# Patient Record
Sex: Female | Born: 1990 | Race: White | Hispanic: No | Marital: Single | Smoking: Current every day smoker
Health system: Southern US, Community
[De-identification: ages and names within clinical notes are randomized; demographics above are authoritative.]

## PROBLEM LIST (undated history)

## (undated) ENCOUNTER — Inpatient Hospital Stay (HOSPITAL_COMMUNITY): Payer: Self-pay

## (undated) DIAGNOSIS — R0602 Shortness of breath: Secondary | ICD-10-CM

## (undated) DIAGNOSIS — L309 Dermatitis, unspecified: Secondary | ICD-10-CM

## (undated) DIAGNOSIS — R51 Headache: Secondary | ICD-10-CM

## (undated) DIAGNOSIS — F32A Depression, unspecified: Secondary | ICD-10-CM

## (undated) DIAGNOSIS — Z3400 Encounter for supervision of normal first pregnancy, unspecified trimester: Secondary | ICD-10-CM

## (undated) DIAGNOSIS — T7840XA Allergy, unspecified, initial encounter: Secondary | ICD-10-CM

## (undated) DIAGNOSIS — F419 Anxiety disorder, unspecified: Secondary | ICD-10-CM

## (undated) DIAGNOSIS — Z789 Other specified health status: Secondary | ICD-10-CM

## (undated) DIAGNOSIS — Z331 Pregnant state, incidental: Secondary | ICD-10-CM

## (undated) DIAGNOSIS — F909 Attention-deficit hyperactivity disorder, unspecified type: Secondary | ICD-10-CM

## (undated) DIAGNOSIS — J42 Unspecified chronic bronchitis: Secondary | ICD-10-CM

## (undated) DIAGNOSIS — N83209 Unspecified ovarian cyst, unspecified side: Secondary | ICD-10-CM

## (undated) HISTORY — DX: Dermatitis, unspecified: L30.9

## (undated) HISTORY — DX: Depression, unspecified: F32.A

## (undated) HISTORY — DX: Allergy, unspecified, initial encounter: T78.40XA

## (undated) HISTORY — DX: Attention-deficit hyperactivity disorder, unspecified type: F90.9

## (undated) HISTORY — DX: Anxiety disorder, unspecified: F41.9

## (undated) HISTORY — PX: TONSILLECTOMY: SUR1361

## (undated) HISTORY — DX: Unspecified ovarian cyst, unspecified side: N83.209

## (undated) HISTORY — PX: WISDOM TOOTH EXTRACTION: SHX21

## (undated) HISTORY — DX: Headache: R51

---
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
## 2012-01-03 LAB — STREP B DNA PROBE: GBS: NEGATIVE

## 2012-01-31 ENCOUNTER — Other Ambulatory Visit: Payer: Self-pay | Admitting: Obstetrics and Gynecology

## 2012-02-01 ENCOUNTER — Telehealth (HOSPITAL_COMMUNITY): Payer: Self-pay | Admitting: *Deleted

## 2012-02-01 ENCOUNTER — Encounter (HOSPITAL_COMMUNITY): Payer: Self-pay | Admitting: *Deleted

## 2012-02-01 NOTE — Telephone Encounter (Signed)
Preadmission screen  

## 2012-02-04 ENCOUNTER — Encounter (HOSPITAL_COMMUNITY): Payer: Self-pay

## 2012-02-04 ENCOUNTER — Inpatient Hospital Stay (HOSPITAL_COMMUNITY)
Admission: RE | Admit: 2012-02-04 | Discharge: 2012-02-07 | DRG: 775 | Disposition: A | Discharge status: Home / Self Care | Payer: Medicaid Other | Source: Ambulatory Visit | Attending: Obstetrics and Gynecology | Admitting: Obstetrics and Gynecology

## 2012-02-04 DIAGNOSIS — O480 Post-term pregnancy: Principal | ICD-10-CM | POA: Diagnosis present

## 2012-02-04 DIAGNOSIS — Z3400 Encounter for supervision of normal first pregnancy, unspecified trimester: Secondary | ICD-10-CM

## 2012-02-04 HISTORY — DX: Encounter for supervision of normal first pregnancy, unspecified trimester: Z34.00

## 2012-02-04 HISTORY — DX: Shortness of breath: R06.02

## 2012-02-04 LAB — CBC
Hemoglobin: 10.4 g/dL — ABNORMAL LOW (ref 12.0–15.0)
MCH: 26.9 pg (ref 26.0–34.0)
MCV: 82.9 fL (ref 78.0–100.0)
RBC: 3.86 MIL/uL — ABNORMAL LOW (ref 3.87–5.11)

## 2012-02-04 MED ORDER — TERBUTALINE SULFATE 1 MG/ML IJ SOLN: 0.2500 mg | Freq: Once | INTRAMUSCULAR | Status: AC | PRN

## 2012-02-04 MED ORDER — ACETAMINOPHEN 325 MG PO TABS: 650.0000 mg | ORAL_TABLET | ORAL | Status: DC | PRN

## 2012-02-04 MED ORDER — OXYTOCIN 20 UNITS IN LACTATED RINGERS INFUSION - SIMPLE: 125.0000 mL/h | Freq: Once | INTRAVENOUS | Status: DC

## 2012-02-04 MED ORDER — LACTATED RINGERS IV SOLN
INTRAVENOUS | Status: DC
  Administered 2012-02-05 (×2): via INTRAVENOUS

## 2012-02-04 MED ORDER — OXYTOCIN BOLUS FROM INFUSION
500.0000 mL | Freq: Once | INTRAVENOUS | Status: DC
  Filled 2012-02-04: qty 500

## 2012-02-04 MED ORDER — DINOPROSTONE 10 MG VA INST
10.0000 mg | VAGINAL_INSERT | Freq: Once | VAGINAL | Status: AC
  Administered 2012-02-04: 10 mg via VAGINAL
  Filled 2012-02-04: qty 1

## 2012-02-04 MED ORDER — OXYTOCIN 20 UNITS IN LACTATED RINGERS INFUSION - SIMPLE
1.0000 m[IU]/min | INTRAVENOUS | Status: DC
  Administered 2012-02-05: 999 m[IU]/min via INTRAVENOUS
  Administered 2012-02-05: 2 m[IU]/min via INTRAVENOUS
  Filled 2012-02-04: qty 1000

## 2012-02-04 MED ORDER — CITRIC ACID-SODIUM CITRATE 334-500 MG/5ML PO SOLN
30.0000 mL | ORAL | Status: DC | PRN
  Administered 2012-02-05: 30 mL via ORAL
  Filled 2012-02-04: qty 15

## 2012-02-04 MED ORDER — PRENATAL MULTIVITAMIN CH: 1.0000 | ORAL_TABLET | Freq: Every day | ORAL | Status: DC

## 2012-02-04 MED ORDER — ZOLPIDEM TARTRATE 10 MG PO TABS
10.0000 mg | ORAL_TABLET | Freq: Every evening | ORAL | Status: DC | PRN
  Administered 2012-02-04: 10 mg via ORAL
  Filled 2012-02-04: qty 1

## 2012-02-04 MED ORDER — FLEET ENEMA 7-19 GM/118ML RE ENEM: 1.0000 | ENEMA | RECTAL | Status: DC | PRN

## 2012-02-04 MED ORDER — ONDANSETRON HCL 4 MG/2ML IJ SOLN: 4.0000 mg | Freq: Four times a day (QID) | INTRAMUSCULAR | Status: DC | PRN

## 2012-02-04 MED ORDER — LACTATED RINGERS IV SOLN
500.0000 mL | INTRAVENOUS | Status: DC | PRN
  Administered 2012-02-05: 250 mL via INTRAVENOUS

## 2012-02-04 MED ORDER — BUTORPHANOL TARTRATE 2 MG/ML IJ SOLN
2.0000 mg | INTRAMUSCULAR | Status: DC | PRN
  Administered 2012-02-05: 2 mg via INTRAVENOUS
  Filled 2012-02-04: qty 1

## 2012-02-04 MED ORDER — LIDOCAINE HCL (PF) 1 % IJ SOLN
30.0000 mL | INTRAMUSCULAR | Status: DC | PRN
  Administered 2012-02-05: 30 mL via SUBCUTANEOUS
  Filled 2012-02-04: qty 30

## 2012-02-04 MED ORDER — IBUPROFEN 600 MG PO TABS: 600.0000 mg | ORAL_TABLET | Freq: Four times a day (QID) | ORAL | Status: DC | PRN

## 2012-02-04 MED ORDER — OXYCODONE-ACETAMINOPHEN 5-325 MG PO TABS: 1.0000 | ORAL_TABLET | ORAL | Status: DC | PRN

## 2012-02-04 NOTE — H&P (Signed)
Robin [name] is a 21 y.o. female presenting for G1P0 @ 40+ for IOL given post dates +FM, no LOF, no VB, occ ctx; largely uncomplicated PNC except Late to Care, ?Septate uterus. Maternal Medical History:  Fetal activity: Perceived fetal activity is normal.      OB History    Grav Para Term Preterm Abortions TAB SAB Ect Mult Living   1 0 0 0 0 0 0 0 0 0      Past Medical History  Diagnosis Date  . Pregnancy as incidental finding   . No pertinent past medical history   . ADHD (attention deficit hyperactivity disorder)   . Chronic bronchitis   . Eczema   . Headache     miagraine  . Ovarian cyst   . Shortness of breath   . Normal pregnancy, first 02/04/2012   Past Surgical History  Procedure Date  . Tonsillectomy    Family History: family history includes COPD in her maternal grandmother; Cancer in her maternal aunt, maternal grandmother, and maternal uncle; Chronic bronchitis in her mother; Depression in her maternal grandmother; Diabetes in her mother; Endometriosis in her mother; Heart attack in her paternal grandfather; Heart disease in her father, maternal grandmother, paternal grandfather, and paternal grandmother; Hyperlipidemia in her mother; Hypertension in her father and maternal grandmother; Hyperthyroidism in her maternal grandmother; Mental illness in her maternal grandmother; Migraines in her maternal grandmother and mother; Miscarriages / Stillbirths in her maternal grandmother and mother; Obesity in her father; and Stroke in her paternal grandfather. Social History:  reports that she quit smoking about 7 months ago. Her smoking use included Cigarettes. She has a .25 pack-year smoking history. She has never used smokeless tobacco. She reports that she does not drink alcohol or use illicit drugs.single Meds PNV, Zantac All PCN   Review of Systems  Constitutional: Negative.   HENT: Negative.   Eyes: Negative.   Respiratory: Negative.   Cardiovascular: Negative.     Gastrointestinal: Negative.   Genitourinary: Negative.   Musculoskeletal: Negative.   Skin: Negative.   Neurological: Negative.   Psychiatric/Behavioral: Negative.     Dilation: Fingertip Effacement (%): 50 Station: -3 Exam by:: Penley, RN  Blood pressure 143/80, pulse 95, temperature 98.6 F (37 C), temperature source Oral, resp. rate 18, height 5\' 4"  (1.626 m), weight 88.905 kg (196 lb), last menstrual period 04/23/2011, SpO2 100.00%. Maternal Exam:  Uterine Assessment: Contraction frequency is irregular.   Abdomen: Patient reports no abdominal tenderness. Fundal height is appropriate for gestation.   Estimated fetal weight is 7#.   Fetal presentation: vertex     Physical Exam  Constitutional: She is oriented to person, place, and time. She appears well-developed and well-nourished.  HENT:  Head: Normocephalic and atraumatic.  Eyes: Conjunctivae are normal. Pupils are equal, round, and reactive to light.  Neck: Normal range of motion. Neck supple.  Cardiovascular: Normal rate and regular rhythm.   Respiratory: Effort normal and breath sounds normal. No respiratory distress.  GI: Soft. Bowel sounds are normal. She exhibits no distension.  Musculoskeletal: Normal range of motion.  Neurological: She is alert and oriented to person, place, and time.  Skin: Skin is warm and dry.  Psychiatric: She has a normal mood and affect. Her behavior is normal.    Prenatal labs: ABO, Rh: O/Positive/-- (09/06 0000) Antibody: Negative (09/06 0000) Rubella: Immune (09/06 0000) RPR: Nonreactive (09/06 0000)  HBsAg: Negative (09/06 0000)  HIV: Non-reactive (09/06 0000)  GBS: Negative (01/07 0000)  glucola 117, Hgb 11.6,  Plt 392K, GC neg, Chl neg, First Tri Scr and AFP neg,glucola 117  [place] EDC 2/1 nl anat, female, post plac Assessment/Plan: 20yo G1 at 40+ for iol given post term, cervidil O/n then AROM and pitocin; epidural, IV pain meds prn, gbbs neg, expect  SVD   [name],Tambi Thole 02/04/2012, 10:16 PM

## 2012-02-05 ENCOUNTER — Inpatient Hospital Stay (HOSPITAL_COMMUNITY): Payer: Medicaid Other | Admitting: Anesthesiology

## 2012-02-05 ENCOUNTER — Encounter (HOSPITAL_COMMUNITY): Payer: Self-pay | Admitting: Anesthesiology

## 2012-02-05 ENCOUNTER — Encounter (HOSPITAL_COMMUNITY): Payer: Self-pay

## 2012-02-05 LAB — RPR: RPR Ser Ql: NONREACTIVE

## 2012-02-05 MED ORDER — PHENYLEPHRINE 40 MCG/ML (10ML) SYRINGE FOR IV PUSH (FOR BLOOD PRESSURE SUPPORT): 80.0000 ug | PREFILLED_SYRINGE | INTRAVENOUS | Status: DC | PRN

## 2012-02-05 MED ORDER — BUPIVACAINE HCL (PF) 0.25 % IJ SOLN
INTRAMUSCULAR | Status: DC | PRN
  Administered 2012-02-05 (×2): 5 mL via EPIDURAL

## 2012-02-05 MED ORDER — EPHEDRINE 5 MG/ML INJ: 10.0000 mg | INTRAVENOUS | Status: DC | PRN

## 2012-02-05 MED ORDER — DIPHENHYDRAMINE HCL 50 MG/ML IJ SOLN: 12.5000 mg | INTRAMUSCULAR | Status: DC | PRN

## 2012-02-05 MED ORDER — LIDOCAINE HCL 1.5 % IJ SOLN
INTRAMUSCULAR | Status: DC | PRN
  Administered 2012-02-05 (×3): 4 mL via INTRADERMAL

## 2012-02-05 MED ORDER — FENTANYL CITRATE 0.05 MG/ML IJ SOLN
INTRAMUSCULAR | Status: DC | PRN
  Administered 2012-02-05: 100 ug via EPIDURAL

## 2012-02-05 MED ORDER — PHENYLEPHRINE 40 MCG/ML (10ML) SYRINGE FOR IV PUSH (FOR BLOOD PRESSURE SUPPORT)
80.0000 ug | PREFILLED_SYRINGE | INTRAVENOUS | Status: DC | PRN
  Filled 2012-02-05: qty 5

## 2012-02-05 MED ORDER — FENTANYL CITRATE 0.05 MG/ML IJ SOLN
INTRAMUSCULAR | Status: AC
  Filled 2012-02-05: qty 2

## 2012-02-05 MED ORDER — EPHEDRINE 5 MG/ML INJ
10.0000 mg | INTRAVENOUS | Status: DC | PRN
  Filled 2012-02-05: qty 4

## 2012-02-05 MED ORDER — LACTATED RINGERS IV SOLN
500.0000 mL | Freq: Once | INTRAVENOUS | Status: AC
  Administered 2012-02-05: 500 mL via INTRAVENOUS

## 2012-02-05 MED ORDER — FENTANYL CITRATE 0.05 MG/ML IJ SOLN: 100.0000 ug | Freq: Once | INTRAMUSCULAR | Status: DC

## 2012-02-05 MED ORDER — FENTANYL 2.5 MCG/ML BUPIVACAINE 1/10 % EPIDURAL INFUSION (WH - ANES)
14.0000 mL/h | INTRAMUSCULAR | Status: DC
  Administered 2012-02-05 (×4): 14 mL/h via EPIDURAL
  Filled 2012-02-05 (×4): qty 60

## 2012-02-05 NOTE — Progress Notes (Signed)
Robin [name] is a 21 y.o. G1P0000 at [redacted]w[redacted]d  admitted for induction of labor due to Post dates.  Subjective: Uncomfortable with contractions  Objective: BP 109/84  Pulse 89  Temp(Src) 98.2 F (36.8 C) (Oral)  Resp 18  Ht 5\' 4"  (1.626 m)  Wt 88.905 kg (196 lb)  BMI 33.64 kg/m2  SpO2 100%  LMP 04/23/2011      FHT:  FHR: 140 bpm, variability: moderate,  accelerations:  Present,  decelerations:  Absent UC:   irregular, every 2-5 minutes SVE:   Dilation: 1 Effacement (%): 70 Station: -2 Exam by:: Carmelina Noun MD AROM for moderate meconium - d/w pt Pitocin at 61mU/min  Labs: Lab Results  Component Value Date   WBC 11.3* 02/04/2012   HGB 10.4* 02/04/2012   HCT 32.0* 02/04/2012   MCV 82.9 02/04/2012   PLT 326 02/04/2012    Assessment / Plan: Induction of labor due to postterm,  progressing slowly with pitocin and AROM  Labor: Progressing slowly Preeclampsia:  no signs or symptoms of toxicity Fetal Wellbeing:  Category II Pain Control:  Epidural and IV pain meds prn I/D:  n/a Anticipated MOD:  NSVD  [name],Crespin Forstrom 02/05/2012, 9:23 AM

## 2012-02-05 NOTE — Anesthesia Procedure Notes (Signed)
Epidural Patient location during procedure: OB Start time: 02/05/2012 11:13 AM Reason for block: procedure for pain  Staffing Performed by: anesthesiologist   Preanesthetic Checklist Completed: patient identified, site marked, surgical consent, pre-op evaluation, timeout performed, IV checked, risks and benefits discussed and monitors and equipment checked  Epidural Patient position: sitting Prep: site prepped and draped and DuraPrep Patient monitoring: continuous pulse ox and blood pressure Approach: midline Injection technique: LOR air  Needle:  Needle type: Tuohy  Needle gauge: 17 G Needle length: 9 cm Needle insertion depth: 6 cm Catheter type: closed end flexible Catheter size: 19 Gauge Catheter at skin depth: 11 cm Test dose: negative  Assessment Events: blood not aspirated, injection not painful, no injection resistance, negative IV test and no paresthesia  Additional Notes Discussed risk of headache, infection, bleeding, nerve injury and failed or incomplete block.  Patient voices understanding and wishes to proceed.

## 2012-02-05 NOTE — Progress Notes (Signed)
Tanee Henery is a 21 y.o. G1P0000 at [redacted]w[redacted]d admitted for induction of labor due to Post dates.  Subjective: comf with epidural, some epigastric pain.    Objective: BP 133/82  Pulse 105  Temp(Src) 97.6 F (36.4 C) (Oral)  Resp 20  Ht 5\' 4"  (1.626 m)  Wt 88.905 kg (196 lb)  BMI 33.64 kg/m2  SpO2 100%  LMP 04/23/2011      FHT:  FHR: 120-130 bpm, variability: moderate,  accelerations:  Present,  decelerations:  Absent UC:   irregular, every 3-6 minutes SVE:   Dilation: 10 Effacement (%): 100 Station: +1 Exam by:: k fields, rn  Labs: Lab Results  Component Value Date   WBC 11.3* 02/04/2012   HGB 10.4* 02/04/2012   HCT 32.0* 02/04/2012   MCV 82.9 02/04/2012   PLT 326 02/04/2012    Assessment / Plan: Induction of labor due to postterm,  progressing well on pitocin  Labor: Progressing normally Preeclampsia:  no signs or symptoms of toxicity Fetal Wellbeing:  Category II Pain Control:  Epidural I/D:  n/a Anticipated MOD:  NSVD Pt to labor down for approximately 1 hr, will then start pushing.  [name],Consuela Widener 02/05/2012, 6:40 PM

## 2012-02-05 NOTE — Progress Notes (Signed)
SVD of viable female 

## 2012-02-05 NOTE — Anesthesia Preprocedure Evaluation (Signed)
Anesthesia Evaluation  Patient identified by MRN, date of birth, ID band Patient awake    Reviewed: Allergy & Precautions, H&P , NPO status , Patient's Chart, lab work & pertinent test results, reviewed documented beta blocker date and time   History of Anesthesia Complications Negative for: history of anesthetic complications  Airway Mallampati: II TM Distance: >3 FB Neck ROM: full    Dental  (+) Teeth Intact   Pulmonary neg pulmonary ROS,  clear to auscultation        Cardiovascular neg cardio ROS regular Normal    Neuro/Psych  Headaches, PSYCHIATRIC DISORDERS (ADHD)    GI/Hepatic negative GI ROS, Neg liver ROS,   Endo/Other  Negative Endocrine ROS  Renal/GU negative Renal ROS  Genitourinary negative   Musculoskeletal   Abdominal   Peds  Hematology negative hematology ROS (+)   Anesthesia Other Findings   Reproductive/Obstetrics (+) Pregnancy                           Anesthesia Physical Anesthesia Plan  ASA: II  Anesthesia Plan: Epidural   Post-op Pain Management:    Induction:   Airway Management Planned:   Additional Equipment:   Intra-op Plan:   Post-operative Plan:   Informed Consent: I have reviewed the patients History and Physical, chart, labs and discussed the procedure including the risks, benefits and alternatives for the proposed anesthesia with the patient or authorized representative who has indicated his/her understanding and acceptance.     Plan Discussed with:   Anesthesia Plan Comments:         Anesthesia Quick Evaluation

## 2012-02-05 NOTE — Progress Notes (Signed)
EFM off for epidural.

## 2012-02-05 NOTE — Progress Notes (Signed)
Difficult to assess pt's level of pain due to pt's description, but pt states she is ok with current level

## 2012-02-05 NOTE — Progress Notes (Signed)
Pt c/o of constant pain in upper abd which is tender to touch. Small amt of bloody show noted and abd resting between ctx... MD made aware of pt's pain and will come assess.

## 2012-02-06 LAB — CBC
HCT: 25.2 % — ABNORMAL LOW (ref 36.0–46.0)
Hemoglobin: 8.3 g/dL — ABNORMAL LOW (ref 12.0–15.0)
MCHC: 32.9 g/dL (ref 30.0–36.0)
MCV: 82.6 fL (ref 78.0–100.0)
RDW: 14.7 % (ref 11.5–15.5)

## 2012-02-06 LAB — ABO/RH: ABO/RH(D): O POS

## 2012-02-06 MED ORDER — OXYCODONE-ACETAMINOPHEN 5-325 MG PO TABS
1.0000 | ORAL_TABLET | ORAL | Status: DC | PRN
  Administered 2012-02-06 (×2): 1 via ORAL
  Administered 2012-02-06: 2 via ORAL
  Administered 2012-02-06 – 2012-02-07 (×3): 1 via ORAL
  Filled 2012-02-06 (×2): qty 1
  Filled 2012-02-06: qty 2
  Filled 2012-02-06 (×3): qty 1

## 2012-02-06 MED ORDER — BENZOCAINE-MENTHOL 20-0.5 % EX AERO
1.0000 "application " | INHALATION_SPRAY | CUTANEOUS | Status: DC | PRN
  Administered 2012-02-06: 1 via TOPICAL

## 2012-02-06 MED ORDER — DIBUCAINE 1 % RE OINT: 1.0000 "application " | TOPICAL_OINTMENT | RECTAL | Status: DC | PRN

## 2012-02-06 MED ORDER — PRENATAL MULTIVITAMIN CH
1.0000 | ORAL_TABLET | Freq: Every day | ORAL | Status: DC
  Administered 2012-02-06 – 2012-02-07 (×2): 1 via ORAL
  Filled 2012-02-06 (×2): qty 1

## 2012-02-06 MED ORDER — BENZOCAINE-MENTHOL 20-0.5 % EX AERO
INHALATION_SPRAY | CUTANEOUS | Status: AC
  Administered 2012-02-06: 1 via TOPICAL
  Filled 2012-02-06: qty 56

## 2012-02-06 MED ORDER — SENNOSIDES-DOCUSATE SODIUM 8.6-50 MG PO TABS
2.0000 | ORAL_TABLET | Freq: Every day | ORAL | Status: DC
  Administered 2012-02-06: 2 via ORAL

## 2012-02-06 MED ORDER — ZOLPIDEM TARTRATE 5 MG PO TABS: 5.0000 mg | ORAL_TABLET | Freq: Every evening | ORAL | Status: DC | PRN

## 2012-02-06 MED ORDER — TETANUS-DIPHTH-ACELL PERTUSSIS 5-2.5-18.5 LF-MCG/0.5 IM SUSP: 0.5000 mL | Freq: Once | INTRAMUSCULAR | Status: DC

## 2012-02-06 MED ORDER — WITCH HAZEL-GLYCERIN EX PADS: 1.0000 "application " | MEDICATED_PAD | CUTANEOUS | Status: DC | PRN

## 2012-02-06 MED ORDER — ONDANSETRON HCL 4 MG PO TABS: 4.0000 mg | ORAL_TABLET | ORAL | Status: DC | PRN

## 2012-02-06 MED ORDER — IBUPROFEN 600 MG PO TABS
600.0000 mg | ORAL_TABLET | Freq: Four times a day (QID) | ORAL | Status: DC
  Administered 2012-02-06 – 2012-02-07 (×6): 600 mg via ORAL
  Filled 2012-02-06 (×6): qty 1

## 2012-02-06 MED ORDER — LACTATED RINGERS IV SOLN: INTRAVENOUS | Status: DC

## 2012-02-06 MED ORDER — DIPHENHYDRAMINE HCL 25 MG PO CAPS: 25.0000 mg | ORAL_CAPSULE | Freq: Four times a day (QID) | ORAL | Status: DC | PRN

## 2012-02-06 MED ORDER — LANOLIN HYDROUS EX OINT: TOPICAL_OINTMENT | CUTANEOUS | Status: DC | PRN

## 2012-02-06 MED ORDER — ONDANSETRON HCL 4 MG/2ML IJ SOLN: 4.0000 mg | INTRAMUSCULAR | Status: DC | PRN

## 2012-02-06 MED ORDER — SIMETHICONE 80 MG PO CHEW: 80.0000 mg | CHEWABLE_TABLET | ORAL | Status: DC | PRN

## 2012-02-06 NOTE — Progress Notes (Signed)
Transferred to room 108 via stretcher with family at side

## 2012-02-06 NOTE — Progress Notes (Signed)
Epidural Time out completed with Dr. Rodman Pickle

## 2012-02-06 NOTE — Anesthesia Postprocedure Evaluation (Signed)
  Anesthesia Post-op Note  Patient: Robin [name]  Procedure(s) Performed: * No procedures listed *  Patient Location: Mother/Baby  Anesthesia Type: Epidural  Level of Consciousness: awake, alert  and oriented  Airway and Oxygen Therapy: Patient Spontanous Breathing  Post-op Pain: mild  Post-op Assessment: Patient's Cardiovascular Status Stable, Respiratory Function Stable, Patent Airway, No signs of Nausea or vomiting and Pain level controlled  Post-op Vital Signs: stable  Complications: No apparent anesthesia complications

## 2012-02-06 NOTE — Progress Notes (Signed)
Post Partum Day 1 Subjective: no complaints, up ad lib and tolerating PO, nl lochia, pain controlled  Objective: Blood pressure 105/69, pulse 98, temperature 98.5 F (36.9 C), temperature source Oral, resp. rate 18, height 5\' 4"  (1.626 m), weight 88.905 kg (196 lb), last menstrual period 04/23/2011, SpO2 100.00%, unknown if currently breastfeeding.  Physical Exam:  General: alert and no distress Lochia: appropriate Uterine Fundus: firm    Basename 02/06/12 0535 02/04/12 1855  HGB 8.3* 10.4*  HCT 25.2* 32.0*    Assessment/Plan: Plan for discharge tomorrow, Breastfeeding and Lactation consult doing well   LOS: 2 days   [name],Umberto Pavek 02/06/2012, 9:51 AM

## 2012-02-07 MED ORDER — OXYCODONE-ACETAMINOPHEN 5-325 MG PO TABS: 1.0000 | ORAL_TABLET | Freq: Four times a day (QID) | ORAL | Status: AC | PRN

## 2012-02-07 MED ORDER — PRENATAL MULTIVITAMIN CH: 1.0000 | ORAL_TABLET | Freq: Every day | ORAL | Status: DC

## 2012-02-07 MED ORDER — IBUPROFEN 800 MG PO TABS: 800.0000 mg | ORAL_TABLET | Freq: Three times a day (TID) | ORAL | Status: AC | PRN

## 2012-02-07 NOTE — Progress Notes (Signed)
Post Partum Day 2 Subjective: no complaints, up ad lib, voiding, tolerating PO and nl lochia, pain controlled  Objective: Blood pressure 121/69, pulse 101, temperature 98.4 F (36.9 C), temperature source Oral, resp. rate 18, height 5\' 4"  (1.626 m), weight 88.905 kg (196 lb), last menstrual period 04/23/2011, SpO2 100.00%, unknown if currently breastfeeding.  Physical Exam:  General: alert and no distress Lochia: appropriate Uterine Fundus: firm   Basename 02/06/12 0535 02/04/12 1855  HGB 8.3* 10.4*  HCT 25.2* 32.0*    Assessment/Plan: Discharge home and Contraception POPs.  D/C with Motrin/Percocet/ PNV, f/u 6 weeks.     LOS: 3 days   [name],Nolon Yellin 02/07/2012, 8:19 AM

## 2012-02-07 NOTE — Progress Notes (Signed)
UR chart review completed.  

## 2012-02-07 NOTE — Discharge Summary (Signed)
Obstetric Discharge Summary Reason for Admission: induction of labor Prenatal Procedures: none Intrapartum Procedures: spontaneous vaginal delivery Postpartum Procedures: none Complications-Operative and Postpartum: 2nd degree perineal laceration Hemoglobin  Date Value Range Status  02/06/2012 8.3* 12.0-15.0 (g/dL) Final     REPEATED TO VERIFY     DELTA CHECK NOTED     HCT  Date Value Range Status  02/06/2012 25.2* 36.0-46.0 (%) Final    Discharge Diagnoses: Term Pregnancy-delivered  Discharge Information: Date: 02/07/2012 Activity: pelvic rest Diet: routine Medications: PNV, Ibuprofen and Percocet Condition: stable Instructions: refer to practice specific booklet Discharge to: home Follow-up Information    Follow up with [name],Robin Stankovich, MD. Schedule an appointment as soon as possible for a visit in 6 weeks.   Contact information:   [address] [facility] [address] [place] [address] [phone]          Newborn Data: Live born female  Birth Weight: 6 lb 11.2 oz (3040 g) APGAR: 7, 9  Home with mother.  [name],Robin [name] 02/07/2012, 8:28 AM

## 2012-05-04 ENCOUNTER — Ambulatory Visit: Payer: Medicaid Other

## 2012-05-16 ENCOUNTER — Ambulatory Visit: Payer: Medicaid Other | Attending: Obstetrics and Gynecology | Admitting: Physical Therapy

## 2012-08-17 ENCOUNTER — Inpatient Hospital Stay (HOSPITAL_COMMUNITY)
Admission: AD | Admit: 2012-08-17 | Discharge: 2012-08-17 | Disposition: A | Discharge status: Home / Self Care | Payer: Medicaid Other | Source: Ambulatory Visit | Attending: Obstetrics & Gynecology | Admitting: Obstetrics & Gynecology

## 2012-08-17 ENCOUNTER — Encounter (HOSPITAL_COMMUNITY): Payer: Self-pay | Admitting: *Deleted

## 2012-08-17 DIAGNOSIS — N949 Unspecified condition associated with female genital organs and menstrual cycle: Secondary | ICD-10-CM | POA: Insufficient documentation

## 2012-08-17 DIAGNOSIS — N938 Other specified abnormal uterine and vaginal bleeding: Secondary | ICD-10-CM | POA: Insufficient documentation

## 2012-08-17 DIAGNOSIS — N939 Abnormal uterine and vaginal bleeding, unspecified: Secondary | ICD-10-CM

## 2012-08-17 DIAGNOSIS — N926 Irregular menstruation, unspecified: Secondary | ICD-10-CM

## 2012-08-17 LAB — URINALYSIS, ROUTINE W REFLEX MICROSCOPIC
Glucose, UA: NEGATIVE mg/dL
Protein, ur: 30 mg/dL — AB
Urobilinogen, UA: 0.2 mg/dL (ref 0.0–1.0)

## 2012-08-17 LAB — WET PREP, GENITAL: Yeast Wet Prep HPF POC: NONE SEEN

## 2012-08-17 LAB — CBC
HCT: 35.9 % — ABNORMAL LOW (ref 36.0–46.0)
Platelets: 342 10*3/uL (ref 150–400)
RDW: 15.2 % (ref 11.5–15.5)
WBC: 7.5 10*3/uL (ref 4.0–10.5)

## 2012-08-17 LAB — URINE MICROSCOPIC-ADD ON

## 2012-08-17 MED ORDER — NORGESTIMATE-ETH ESTRADIOL 0.25-35 MG-MCG PO TABS: 1.0000 | ORAL_TABLET | Freq: Every day | ORAL | Status: DC

## 2012-08-17 NOTE — MAU Provider Note (Signed)
History     CSN: [id number]  Arrival date and time: 08/17/12 1604   First Provider Initiated Contact with Patient 08/17/12 1647      Chief Complaint  Patient presents with  . Vaginal Bleeding   HPI Pt is not pregnant and has been bleeding on Implanon since she delivered 01/2012.  She says she is bleeding every hour to 2 hours.  She has a few clots.  She has cramps.  She takes ibuprofen.  Past Medical History  Diagnosis Date  . Pregnancy as incidental finding   . No pertinent past medical history   . ADHD (attention deficit hyperactivity disorder)   . Chronic bronchitis   . Eczema   . Headache     miagraine  . Ovarian cyst   . Shortness of breath   . Normal pregnancy, first 02/04/2012  . SVD (spontaneous vaginal delivery) 02/05/2012    Past Surgical History  Procedure Date  . Tonsillectomy     Family History  Problem Relation Age of Onset  . Diabetes Mother   . Hyperlipidemia Mother   . Chronic bronchitis Mother   . Endometriosis Mother   . Migraines Mother   . Miscarriages / [place] Mother   . Cancer Maternal Aunt     renal  . Cancer Maternal Uncle     breast  . Mental illness Maternal Grandmother     bipolar, paranoid schizophrenia  . COPD Maternal Grandmother   . Depression Maternal Grandmother   . Heart disease Maternal Grandmother   . Hypertension Maternal Grandmother   . Hyperthyroidism Maternal Grandmother   . Cancer Maternal Grandmother     liver  . Migraines Maternal Grandmother   . Miscarriages / Stillbirths Maternal Grandmother   . Heart disease Paternal Grandmother   . Heart disease Paternal Grandfather   . Heart attack Paternal Grandfather   . Stroke Paternal Grandfather   . Heart disease Father   . Obesity Father   . Hypertension Father     History  Substance Use Topics  . Smoking status: Former Smoker -- 0.2 packs/day for 1 years    Types: Cigarettes    Quit date: 07/01/2011  . Smokeless tobacco: Never Used  . Alcohol Use: No     Allergies:  Allergies  Allergen Reactions  . Penicillins Hives and Itching    Prescriptions prior to admission  Medication Sig Dispense Refill  . Prenatal Vit-Fe Fumarate-FA (PRENATAL MULTIVITAMIN) TABS Take 1 tablet by mouth daily.  30 tablet  12  . ranitidine (ZANTAC) 150 MG tablet Take 150 mg by mouth 2 (two) times daily.      Marland Kitchen DISCONTD: Prenatal Vit-Fe Fumarate-FA (PRENATAL MULTIVITAMIN) TABS Take 1 tablet by mouth daily.        Review of Systems  Constitutional: Negative for fever and chills.  Gastrointestinal: Positive for abdominal pain. Negative for nausea, vomiting, diarrhea and constipation.  Genitourinary: Negative for dysuria and urgency.   Physical Exam   Blood pressure 135/77, pulse 89, temperature 98.5 F (36.9 C), temperature source Oral, resp. rate 18, height 5\' 3"  (1.6 m), weight 73.936 kg (163 lb), unknown if currently breastfeeding.  Physical Exam  Vitals reviewed. Constitutional: She is oriented to person, place, and time. She appears well-developed and well-nourished.  HENT:  Head: Normocephalic.  Eyes: Pupils are equal, round, and reactive to light.  Neck: Normal range of motion. Neck supple.  Cardiovascular: Normal rate.   Respiratory: Effort normal.  GI: Soft. She exhibits no distension. There is tenderness. There  is no rebound and no guarding.  Genitourinary:       Small amount of dark brown discharge in vault; cervix closed, mildly tender; bimanual diffusely tender - no appreciable enlargement  Musculoskeletal: Normal range of motion.  Neurological: She is alert and oriented to person, place, and time.  Skin: Skin is warm and dry.  Psychiatric: She has a normal mood and affect.    MAU Course  Procedures Results for orders placed during the hospital encounter of 08/17/12 (from the past 24 hour(s))  CBC     Status: Abnormal   Collection Time   08/17/12  4:23 PM      Component Value Range   WBC 7.5  4.0 - 10.5 K/uL   RBC 4.74  3.87 - 5.11  MIL/uL   Hemoglobin 11.2 (*) 12.0 - 15.0 g/dL   HCT 16.1 (*) 09.6 - 04.5 %   MCV 75.7 (*) 78.0 - 100.0 fL   MCH 23.6 (*) 26.0 - 34.0 pg   MCHC 31.2  30.0 - 36.0 g/dL   RDW 40.9  81.1 - 91.4 %   Platelets 342  150 - 400 K/uL  WET PREP, GENITAL     Status: Abnormal   Collection Time   08/17/12  4:45 PM      Component Value Range   Yeast Wet Prep HPF POC NONE SEEN  NONE SEEN   Trich, Wet Prep NONE SEEN  NONE SEEN   Clue Cells Wet Prep HPF POC FEW (*) NONE SEEN   WBC, Wet Prep HPF POC FEW (*) NONE SEEN  POCT PREGNANCY, URINE     Status: Normal   Collection Time   08/17/12  5:00 PM      Component Value Range   Preg Test, Ur NEGATIVE  NEGATIVE    Assessment and Plan  BTB on Implanon Will start on BCP 2 daily until bleeding stops then one daily for 2 months F/u with [facility] after Medicaid re-instated  Chany Woolworth 08/17/2012, 4:48 PM
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
## 2012-09-07 ENCOUNTER — Encounter (HOSPITAL_COMMUNITY): Payer: Self-pay | Admitting: Emergency Medicine

## 2012-09-07 ENCOUNTER — Emergency Department (HOSPITAL_COMMUNITY)
Admission: EM | Admit: 2012-09-07 | Discharge: 2012-09-07 | Disposition: A | Discharge status: Home / Self Care | Payer: Medicaid Other | Attending: Emergency Medicine | Admitting: Emergency Medicine

## 2012-09-07 DIAGNOSIS — B9789 Other viral agents as the cause of diseases classified elsewhere: Secondary | ICD-10-CM | POA: Insufficient documentation

## 2012-09-07 DIAGNOSIS — F909 Attention-deficit hyperactivity disorder, unspecified type: Secondary | ICD-10-CM | POA: Insufficient documentation

## 2012-09-07 DIAGNOSIS — Z87891 Personal history of nicotine dependence: Secondary | ICD-10-CM | POA: Insufficient documentation

## 2012-09-07 DIAGNOSIS — B349 Viral infection, unspecified: Secondary | ICD-10-CM

## 2012-09-07 MED ORDER — LACTINEX PO CHEW: 1.0000 | CHEWABLE_TABLET | Freq: Three times a day (TID) | ORAL | Status: DC

## 2012-09-07 MED ORDER — ONDANSETRON 8 MG PO TBDP: 8.0000 mg | ORAL_TABLET | Freq: Three times a day (TID) | ORAL | Status: AC | PRN

## 2012-09-07 NOTE — ED Provider Notes (Signed)
History     CSN: [id number]  Arrival date & time 09/07/12  1200   First MD Initiated Contact with Patient 09/07/12 1336      Chief Complaint  Patient presents with  . Fever    (Consider location/radiation/quality/duration/timing/severity/associated sxs/prior treatment) Patient is a 21 y.o. female presenting with fever. The history is provided by the patient.  Fever Primary symptoms of the febrile illness include fever, fatigue, headaches, cough, nausea, vomiting, diarrhea and myalgias. Primary symptoms do not include wheezing, shortness of breath, abdominal pain, dysuria, arthralgias or rash. The current episode started yesterday. This is a new problem. The problem has not changed since onset. The headache is not associated with weakness.  The vomiting began today. Vomiting occurs 2 to 5 times per day. The emesis contains stomach contents.  The diarrhea began today. The diarrhea is watery. The diarrhea occurs 2 to 4 times per day.  Myalgias began yesterday. The myalgias have been gradually improving since their onset. The myalgias are generalized. The myalgias are aching. The discomfort from the myalgias is mild. The myalgias are not associated with weakness, tenderness or swelling.   entire siblings and mother sick with cough/cold Past Medical History  Diagnosis Date  . Pregnancy as incidental finding   . No pertinent past medical history   . ADHD (attention deficit hyperactivity disorder)   . Chronic bronchitis   . Eczema   . Headache     miagraine  . Ovarian cyst   . Shortness of breath   . Normal pregnancy, first 02/04/2012  . SVD (spontaneous vaginal delivery) 02/05/2012    Past Surgical History  Procedure Date  . Tonsillectomy     Family History  Problem Relation Age of Onset  . Diabetes Mother   . Hyperlipidemia Mother   . Chronic bronchitis Mother   . Endometriosis Mother   . Migraines Mother   . Miscarriages / [place] Mother   . Cancer Maternal Aunt    renal  . Cancer Maternal Uncle     breast  . Mental illness Maternal Grandmother     bipolar, paranoid schizophrenia  . COPD Maternal Grandmother   . Depression Maternal Grandmother   . Heart disease Maternal Grandmother   . Hypertension Maternal Grandmother   . Hyperthyroidism Maternal Grandmother   . Cancer Maternal Grandmother     liver  . Migraines Maternal Grandmother   . Miscarriages / Stillbirths Maternal Grandmother   . Heart disease Paternal Grandmother   . Heart disease Paternal Grandfather   . Heart attack Paternal Grandfather   . Stroke Paternal Grandfather   . Heart disease Father   . Obesity Father   . Hypertension Father     History  Substance Use Topics  . Smoking status: Former Smoker -- 0.2 packs/day for 1 years    Types: Cigarettes    Quit date: 07/01/2011  . Smokeless tobacco: Never Used  . Alcohol Use: No    OB History    Grav Para Term Preterm Abortions TAB SAB Ect Mult Living   1 1 1  0 0 0 0 0 0 1      Review of Systems  Constitutional: Positive for fever and fatigue.  Respiratory: Positive for cough. Negative for shortness of breath and wheezing.   Gastrointestinal: Positive for nausea, vomiting and diarrhea. Negative for abdominal pain.  Genitourinary: Negative for dysuria.  Musculoskeletal: Positive for myalgias. Negative for arthralgias.  Skin: Negative for rash.  Neurological: Positive for headaches. Negative for weakness.  All other  systems reviewed and are negative.    Allergies  Penicillins  Home Medications   Current Outpatient Rx  Name Route Sig Dispense Refill  . IBUPROFEN 200 MG PO TABS Oral Take 400 mg by mouth every 6 (six) hours as needed. For pain    . IBUPROFEN 800 MG PO TABS Oral Take 800 mg by mouth every 8 (eight) hours as needed. migraine    . NORGESTIMATE-ETH ESTRADIOL 0.25-35 MG-MCG PO TABS Oral Take 1 tablet by mouth daily.    Marland Kitchen OVER THE COUNTER MEDICATION Oral Take 30 mLs by mouth daily as needed. For  flu-symptoms   otc flu relief    . LACTINEX PO CHEW Oral Chew 1 tablet by mouth 3 (three) times daily with meals. For 5 days 15 tablet 0  . ONDANSETRON 8 MG PO TBDP Oral Take 1 tablet (8 mg total) by mouth every 8 (eight) hours as needed for nausea (and vomiting for 1-2 days). 12 tablet 0    BP 121/67  Pulse 113  Temp 98.8 F (37.1 C)  Resp 16  Wt 159 lb 6 oz (72.292 kg)  SpO2 100%  LMP 09/04/2012  Breastfeeding? No  Physical Exam  Nursing note and vitals reviewed. Constitutional: She appears well-developed and well-nourished. No distress.  HENT:  Head: Normocephalic and atraumatic.  Right Ear: External ear normal.  Left Ear: External ear normal.  Nose: Mucosal edema and rhinorrhea present.  Eyes: Conjunctivae normal are normal. Right eye exhibits no discharge. Left eye exhibits no discharge. No scleral icterus.  Neck: Neck supple. No tracheal deviation present.  Cardiovascular: Normal rate.   Pulmonary/Chest: Effort normal. No stridor. No respiratory distress.  Musculoskeletal: She exhibits no edema.  Neurological: She is alert. Cranial nerve deficit: no gross deficits.  Skin: Skin is warm and dry. No rash noted.  Psychiatric: She has a normal mood and affect.    ED Course  Procedures (including critical care time)  Labs Reviewed - No data to display No results found.   1. Viral syndrome       MDM  Family questions answered and reassurance given and agrees with d/c and plan at this time. Child remains non toxic appearing and at this time most likely viral infection         Mickala Laton C. Erlinda Solinger, DO 09/07/12 1400

## 2012-09-07 NOTE — ED Notes (Signed)
Pt started with a fever last night , pt states it went as high as 102.8. She has been vomiting, diarrhea, and runny nose. Has a huge lymph swollen node on left side of her neck

## 2012-12-15 ENCOUNTER — Emergency Department: Payer: Self-pay | Admitting: Emergency Medicine

## 2013-01-11 ENCOUNTER — Emergency Department: Payer: Self-pay | Admitting: Emergency Medicine

## 2013-01-11 LAB — RAPID INFLUENZA A&B ANTIGENS

## 2013-08-31 ENCOUNTER — Encounter (HOSPITAL_COMMUNITY): Payer: Self-pay | Admitting: Emergency Medicine

## 2013-08-31 ENCOUNTER — Emergency Department (HOSPITAL_COMMUNITY)
Admission: EM | Admit: 2013-08-31 | Discharge: 2013-08-31 | Disposition: A | Discharge status: Home / Self Care | Payer: Medicaid Other | Attending: Emergency Medicine | Admitting: Emergency Medicine

## 2013-08-31 ENCOUNTER — Emergency Department (HOSPITAL_COMMUNITY): Payer: Medicaid Other

## 2013-08-31 DIAGNOSIS — Z8679 Personal history of other diseases of the circulatory system: Secondary | ICD-10-CM | POA: Insufficient documentation

## 2013-08-31 DIAGNOSIS — Z880 Allergy status to penicillin: Secondary | ICD-10-CM | POA: Insufficient documentation

## 2013-08-31 DIAGNOSIS — Z8742 Personal history of other diseases of the female genital tract: Secondary | ICD-10-CM | POA: Insufficient documentation

## 2013-08-31 DIAGNOSIS — S0993XA Unspecified injury of face, initial encounter: Secondary | ICD-10-CM | POA: Insufficient documentation

## 2013-08-31 DIAGNOSIS — R110 Nausea: Secondary | ICD-10-CM | POA: Insufficient documentation

## 2013-08-31 DIAGNOSIS — Z87891 Personal history of nicotine dependence: Secondary | ICD-10-CM | POA: Insufficient documentation

## 2013-08-31 DIAGNOSIS — Z872 Personal history of diseases of the skin and subcutaneous tissue: Secondary | ICD-10-CM | POA: Insufficient documentation

## 2013-08-31 DIAGNOSIS — Z8659 Personal history of other mental and behavioral disorders: Secondary | ICD-10-CM | POA: Insufficient documentation

## 2013-08-31 DIAGNOSIS — S060X9A Concussion with loss of consciousness of unspecified duration, initial encounter: Secondary | ICD-10-CM | POA: Insufficient documentation

## 2013-08-31 DIAGNOSIS — IMO0002 Reserved for concepts with insufficient information to code with codable children: Secondary | ICD-10-CM

## 2013-08-31 DIAGNOSIS — Z8709 Personal history of other diseases of the respiratory system: Secondary | ICD-10-CM | POA: Insufficient documentation

## 2013-08-31 MED ORDER — ONDANSETRON HCL 4 MG/2ML IJ SOLN
4.0000 mg | Freq: Once | INTRAMUSCULAR | Status: DC
  Filled 2013-08-31: qty 2

## 2013-08-31 MED ORDER — HYDROMORPHONE HCL PF 1 MG/ML IJ SOLN
1.0000 mg | Freq: Once | INTRAMUSCULAR | Status: DC
  Filled 2013-08-31: qty 1

## 2013-08-31 MED ORDER — ONDANSETRON 4 MG PO TBDP: 4.0000 mg | ORAL_TABLET | Freq: Three times a day (TID) | ORAL | Status: DC | PRN

## 2013-08-31 MED ORDER — OXYCODONE-ACETAMINOPHEN 5-325 MG PO TABS: 1.0000 | ORAL_TABLET | ORAL | Status: DC | PRN

## 2013-08-31 MED ORDER — ONDANSETRON 4 MG PO TBDP
8.0000 mg | ORAL_TABLET | Freq: Once | ORAL | Status: AC
  Administered 2013-08-31: 8 mg via ORAL
  Filled 2013-08-31: qty 2

## 2013-08-31 MED ORDER — HYDROMORPHONE HCL PF 2 MG/ML IJ SOLN
2.0000 mg | Freq: Once | INTRAMUSCULAR | Status: AC
  Administered 2013-08-31: 2 mg via INTRAMUSCULAR
  Filled 2013-08-31: qty 1

## 2013-08-31 NOTE — ED Provider Notes (Signed)
Medical screening examination/treatment/procedure(s) were performed by non-physician practitioner and as supervising physician I was immediately available for consultation/collaboration.   Darlys Gales, MD 08/31/13 2252
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
## 2013-09-02 ENCOUNTER — Emergency Department (HOSPITAL_COMMUNITY)
Admission: EM | Admit: 2013-09-02 | Discharge: 2013-09-03 | Disposition: A | Discharge status: Home / Self Care | Payer: Medicaid Other | Attending: Emergency Medicine | Admitting: Emergency Medicine

## 2013-09-02 ENCOUNTER — Encounter (HOSPITAL_COMMUNITY): Payer: Self-pay | Admitting: *Deleted

## 2013-09-02 DIAGNOSIS — Z8742 Personal history of other diseases of the female genital tract: Secondary | ICD-10-CM | POA: Insufficient documentation

## 2013-09-02 DIAGNOSIS — F909 Attention-deficit hyperactivity disorder, unspecified type: Secondary | ICD-10-CM | POA: Insufficient documentation

## 2013-09-02 DIAGNOSIS — IMO0002 Reserved for concepts with insufficient information to code with codable children: Secondary | ICD-10-CM | POA: Insufficient documentation

## 2013-09-02 DIAGNOSIS — S060X0S Concussion without loss of consciousness, sequela: Secondary | ICD-10-CM

## 2013-09-02 DIAGNOSIS — Y929 Unspecified place or not applicable: Secondary | ICD-10-CM | POA: Insufficient documentation

## 2013-09-02 DIAGNOSIS — S060X0A Concussion without loss of consciousness, initial encounter: Secondary | ICD-10-CM | POA: Insufficient documentation

## 2013-09-02 DIAGNOSIS — Z87891 Personal history of nicotine dependence: Secondary | ICD-10-CM | POA: Insufficient documentation

## 2013-09-02 DIAGNOSIS — L259 Unspecified contact dermatitis, unspecified cause: Secondary | ICD-10-CM | POA: Insufficient documentation

## 2013-09-02 DIAGNOSIS — R42 Dizziness and giddiness: Secondary | ICD-10-CM | POA: Insufficient documentation

## 2013-09-02 DIAGNOSIS — Z8669 Personal history of other diseases of the nervous system and sense organs: Secondary | ICD-10-CM | POA: Insufficient documentation

## 2013-09-02 DIAGNOSIS — Y939 Activity, unspecified: Secondary | ICD-10-CM | POA: Insufficient documentation

## 2013-09-02 DIAGNOSIS — Z880 Allergy status to penicillin: Secondary | ICD-10-CM | POA: Insufficient documentation

## 2013-09-02 DIAGNOSIS — R209 Unspecified disturbances of skin sensation: Secondary | ICD-10-CM | POA: Insufficient documentation

## 2013-09-02 MED ORDER — MECLIZINE HCL 50 MG PO TABS: 25.0000 mg | ORAL_TABLET | Freq: Three times a day (TID) | ORAL | Status: DC | PRN
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
## 2013-09-03 MED ORDER — MORPHINE SULFATE 4 MG/ML IJ SOLN
4.0000 mg | Freq: Once | INTRAMUSCULAR | Status: AC
  Administered 2013-09-03: 4 mg via INTRAMUSCULAR
  Filled 2013-09-03: qty 1

## 2013-09-03 MED ORDER — MECLIZINE HCL 25 MG PO TABS
25.0000 mg | ORAL_TABLET | Freq: Once | ORAL | Status: AC
  Administered 2013-09-03: 25 mg via ORAL
  Filled 2013-09-03: qty 1

## 2013-09-05 ENCOUNTER — Ambulatory Visit: Payer: Medicaid Other | Admitting: Neurology

## 2014-03-20 ENCOUNTER — Inpatient Hospital Stay (HOSPITAL_COMMUNITY)
Admission: AD | Admit: 2014-03-20 | Discharge: 2014-03-20 | Disposition: A | Discharge status: Home / Self Care | Payer: Medicaid Other | Source: Ambulatory Visit | Attending: Obstetrics and Gynecology | Admitting: Obstetrics and Gynecology

## 2014-03-20 NOTE — MAU Note (Signed)
Pt not in lobby.  

## 2014-03-22 ENCOUNTER — Encounter (HOSPITAL_COMMUNITY): Payer: Self-pay | Admitting: *Deleted

## 2014-03-22 ENCOUNTER — Inpatient Hospital Stay (HOSPITAL_COMMUNITY)
Admission: AD | Admit: 2014-03-22 | Discharge: 2014-03-22 | Disposition: A | Discharge status: Home / Self Care | Payer: Medicaid Other | Source: Ambulatory Visit | Attending: Obstetrics and Gynecology | Admitting: Obstetrics and Gynecology

## 2014-03-22 DIAGNOSIS — F17200 Nicotine dependence, unspecified, uncomplicated: Secondary | ICD-10-CM | POA: Insufficient documentation

## 2014-03-22 DIAGNOSIS — M79602 Pain in left arm: Secondary | ICD-10-CM

## 2014-03-22 DIAGNOSIS — Z3046 Encounter for surveillance of implantable subdermal contraceptive: Secondary | ICD-10-CM | POA: Insufficient documentation

## 2014-03-22 DIAGNOSIS — M79609 Pain in unspecified limb: Secondary | ICD-10-CM

## 2014-03-22 LAB — URINE MICROSCOPIC-ADD ON

## 2014-03-22 LAB — URINALYSIS, ROUTINE W REFLEX MICROSCOPIC
Bilirubin Urine: NEGATIVE
Glucose, UA: NEGATIVE mg/dL
KETONES UR: NEGATIVE mg/dL
LEUKOCYTES UA: NEGATIVE
NITRITE: NEGATIVE
PROTEIN: NEGATIVE mg/dL
Specific Gravity, Urine: 1.015 (ref 1.005–1.030)
UROBILINOGEN UA: 0.2 mg/dL (ref 0.0–1.0)
pH: 6 (ref 5.0–8.0)

## 2014-03-22 LAB — POCT PREGNANCY, URINE: PREG TEST UR: NEGATIVE

## 2014-03-22 MED ORDER — IBUPROFEN 800 MG PO TABS: 800.0000 mg | ORAL_TABLET | Freq: Three times a day (TID) | ORAL | Status: DC | PRN

## 2014-03-22 NOTE — MAU Provider Note (Signed)
History     CSN: [id number]  Arrival date and time: 03/22/14 1954   First Provider Initiated Contact with Patient 03/22/14 2223      Chief Complaint  Patient presents with  . Contraception   HPI Comments: Robin [name] 23 y.o. G1P1001 presents to MAU to have her Nexplanon removed because she feels it has moved and is hurting her. It has been in place for 2 years and she has an appointment to have it removed on Tuesday.     Past Medical History  Diagnosis Date  . Pregnancy as incidental finding   . No pertinent past medical history   . ADHD (attention deficit hyperactivity disorder)   . Chronic bronchitis   . Eczema   . Headache(784.0)     miagraine  . Ovarian cyst   . Shortness of breath   . Normal pregnancy, first 02/04/2012  . SVD (spontaneous vaginal delivery) 02/05/2012    Past Surgical History  Procedure Laterality Date  . Tonsillectomy      Family History  Problem Relation Age of Onset  . Diabetes Mother   . Hyperlipidemia Mother   . Chronic bronchitis Mother   . Endometriosis Mother   . Migraines Mother   . Misc[place]riages / [place] Mother   . Cancer Maternal Aunt     renal  . Cancer Maternal Uncle     breast  . Mental illness Maternal Grandmother     bipolar, paranoid schizophrenia  . COPD Maternal Grandmother   . Depression Maternal Grandmother   . Heart disease Maternal Grandmother   . Hypertension Maternal Grandmother   . Hyperthyroidism Maternal Grandmother   . Cancer Maternal Grandmother     liver  . Migraines Maternal Grandmother   . Miscarriages / Stillbirths Maternal Grandmother   . Heart disease Paternal Grandmother   . Heart disease Paternal Grandfather   . Heart attack Paternal Grandfather   . Stroke Paternal Grandfather   . Heart disease Father   . Obesity Father   . Hypertension Father     History  Substance Use Topics  . Smoking status: Current Every Day Smoker -- 0.50 packs/day for 1 years    Types: Cigarettes    Last  Attempt to Quit: 07/01/2011  . Smokeless tobacco: Never Used  . Alcohol Use: No    Allergies:  Allergies  Allergen Reactions  . Penicillins Hives and Itching    Prescriptions prior to admission  Medication Sig Dispense Refill  . etonogestrel (NEXPLANON) 68 MG IMPL implant Inject 1 each into the skin once.      Marland Kitchen. HYDROcodone-acetaminophen (NORCO/VICODIN) 5-325 MG per tablet Take 1 tablet by mouth every 6 (six) hours as needed for moderate pain.      Marland Kitchen. ondansetron (ZOFRAN ODT) 4 MG disintegrating tablet Take 1 tablet (4 mg total) by mouth every 8 (eight) hours as needed for nausea.  10 tablet  0    Review of Systems  Musculoskeletal:       Pain with Nexplanon    Physical Exam   Blood pressure 137/78, temperature 98.7 F (37.1 C), resp. rate 20, height 5\' 3"  (1.6 m), weight 156 lb 9.6 oz (71.033 kg), last menstrual period 03/15/2014.  Physical Exam  Constitutional: She appears well-developed and well-nourished.  Musculoskeletal:  Nexplanon palpable without redness, swelling, tenderness. No sx infection.   Psychiatric: Her affect is blunt. She is withdrawn.    MAU Course  Procedures  MDM    Assessment and Plan   A: Left arm  pain  P: Motrin 800 mg po TID prn pain Advised we do not remove Nexplanons in MAU Keep appointment for removal on Tuesday  Carolynn Serve 03/22/2014, 10:30 PM

## 2014-03-22 NOTE — Discharge Instructions (Signed)
Contraception Choices °Birth control (contraception) is the use of any methods or devices to stop pregnancy from happening. Below are some methods to help avoid pregnancy. °HORMONAL BIRTH CONTROL °· A small tube put under the skin of the upper arm (implant). The tube can stay in place for 3 years. The implant must be taken out after 3 years. °· Shots given every 3 months. °· Pills taken every day. °· Patches that are changed once a week. °· A ring put into the vagina (vaginal ring). The ring is left in place for 3 weeks and removed for 1 week. Then, a new ring is put in the vagina. °· Emergency birth control pills taken after unprotected sex (intercourse). °BARRIER BIRTH CONTROL  °· A thin covering worn on the penis (female condom) during sex. °· A soft, loose covering put into the vagina (female condom) before sex. °· A rubber bowl that sits over the cervix (diaphragm). The bowl must be made for you. The bowl is put into the vagina before sex. The bowl is left in place for 6 to 8 hours after sex. °· A small, soft cup that fits over the cervix (cervical cap). The cup must be made for you. The cup can be left in place for 48 hours after sex. °· A sponge that is put into the vagina before sex. °· A chemical that kills or stops sperm from getting into the cervix and uterus (spermicide). The chemical may be a cream, jelly, foam, or pill. °INTRAUTERINE (IUD) BIRTH CONTROL  °· IUD birth control is a small, T-shaped piece of plastic. The plastic is put inside the uterus. There are 2 types of IUD: °· Copper IUD. The IUD is covered in copper wire. The copper makes a fluid that kills sperm. It can stay in place for 10 years. °· Hormone IUD. The hormone stops pregnancy from happening. It can stay in place for 5 years. °PERMANENT METHODS °· When the woman has her fallopian tubes sealed, tied, or blocked during surgery. This stops the egg from traveling to the uterus. °· The doctor places a small coil or insert into each fallopian  tube. This causes scar tissue to form and blocks the fallopian tubes. °· When the female has the tubes that carry sperm tied off (vasectomy). °NATURAL FAMILY PLANNING BIRTH CONTROL  °· Natural family planning means not having sex or using barrier birth control on the days the woman could become pregnant. °· Use a calendar to keep track of the length of each period and know the days she can get pregnant. °· Avoid sex during ovulation. °· Use a thermometer to measure body temperature. Also watch for symptoms of ovulation. °· Time sex to be after the woman has ovulated. °Use condoms to help protect yourself against sexually transmitted infections (STIs). Do this no matter what type of birth control you use. Talk to your doctor about which type of birth control is best for you. °Document Released: 10/10/2009 Document Revised: 08/15/2013 Document Reviewed: 07/04/2013 °Exit[employer]® Patient Information ©2014 [employer]. ° °

## 2014-03-22 NOTE — MAU Note (Addendum)
[facility] [facility] [place] and was given Hydrocodone until could see doc Tues but cannot wait. Had Nexplanon placed about 2 yrs ago. Has moved down near elbow and hurts to straighten arm. Was told to wear sling and not to straighten arm. Causing pain and nausea. Sometimes arm goes numb. (area does not look red or swollen but pt states is sore to touch) Has off and on spotting and sometimes heavy periods

## 2014-07-31 LAB — OB RESULTS CONSOLE ANTIBODY SCREEN: ANTIBODY SCREEN: NEGATIVE

## 2014-07-31 LAB — OB RESULTS CONSOLE HIV ANTIBODY (ROUTINE TESTING): HIV: NONREACTIVE

## 2014-07-31 LAB — OB RESULTS CONSOLE ABO/RH: RH Type: POSITIVE

## 2014-07-31 LAB — OB RESULTS CONSOLE HEPATITIS B SURFACE ANTIGEN: Hepatitis B Surface Ag: NEGATIVE

## 2014-07-31 LAB — OB RESULTS CONSOLE GC/CHLAMYDIA
Chlamydia: NEGATIVE
Gonorrhea: NEGATIVE

## 2014-07-31 LAB — OB RESULTS CONSOLE RPR: RPR: NONREACTIVE

## 2014-07-31 LAB — OB RESULTS CONSOLE RUBELLA ANTIBODY, IGM: Rubella: IMMUNE

## 2014-08-15 ENCOUNTER — Encounter: Payer: Self-pay | Admitting: Neurology

## 2014-08-15 ENCOUNTER — Ambulatory Visit (INDEPENDENT_AMBULATORY_CARE_PROVIDER_SITE_OTHER): Payer: Medicaid Other | Admitting: Neurology

## 2014-08-15 VITALS — BP 112/73 | HR 92 | Ht 63.0 in | Wt 161.0 lb

## 2014-08-15 DIAGNOSIS — O0001 Abdominal pregnancy with intrauterine pregnancy: Secondary | ICD-10-CM

## 2014-08-15 DIAGNOSIS — R519 Headache, unspecified: Secondary | ICD-10-CM

## 2014-08-15 DIAGNOSIS — G43909 Migraine, unspecified, not intractable, without status migrainosus: Secondary | ICD-10-CM | POA: Insufficient documentation

## 2014-08-15 DIAGNOSIS — T3995XA Adverse effect of unspecified nonopioid analgesic, antipyretic and antirheumatic, initial encounter: Secondary | ICD-10-CM

## 2014-08-15 DIAGNOSIS — R51 Headache: Secondary | ICD-10-CM

## 2014-08-15 DIAGNOSIS — G4440 Drug-induced headache, not elsewhere classified, not intractable: Secondary | ICD-10-CM

## 2014-08-15 MED ORDER — PROPRANOLOL HCL ER 60 MG PO CP24: 60.0000 mg | ORAL_CAPSULE | Freq: Every day | ORAL | Status: DC

## 2014-08-15 MED ORDER — ACETAMINOPHEN-CODEINE #3 300-30 MG PO TABS: 2.0000 | ORAL_TABLET | Freq: Four times a day (QID) | ORAL | Status: DC | PRN

## 2014-08-15 NOTE — Progress Notes (Signed)
[facility] [address] [place].  [address] [phone]       OFFICE CONSULT NOTE  [name] Date of Birth:  29-Jul-1991 Medical Record Number:  [id number]   Referring MD:  Robin [name]  Reason for Referral:  Headache and numbness  HPI: 11 year Caucasian lady who is [redacted] weeks pregnant and has had a history of migraine headaches for the last one year or so. She describes the headache as being retro-orbital and occipital throbbing and severe in nature 8/10 in severity. Accompanied by nausea light and sound sensitivity. It is increased with physical activity and partially relieved by rest. She denies any focal neurological symptoms or complaint headache or typical zigzag lines or visual phenomena but does have occasional blurred vision with the headaches. She also had some dizziness when the headache is severe. She has tried taking Tylenol and ibuprofen which did not help. His the last 3-4 months cinches pregnant she is having daily headaches. She has been taking 2-4 tablets of Tylenol every day without relief. She does have some history of migraine in her mother. She drinks 4 cups of soda every day. She is unable to admit for specific triggers for her headaches. She does admit that she gets daily for 5 hours of sleep every night. She does have a 81-year-old daughter. She is currently not working. She has never been on migraine specific medications for preventative medications in the past. She did have a CT scan of the head on 08/31/13 which I personally reviewed 2 unremarkable but has not had any MRI scan done. She complains of some numbness on the right side of face and arm and occasionally the leg as well. She denies any weakness, gait and balance difficulties, vertigo, diplopia, fatigability or lack of bladder bowel control or urgency. She has never seen a neurologist or been evaluated in a headache clinic.  ROS:   14 system review of systems is positive for  headache, eye pain, blurred vision, numbness, confusion, memory loss, dizziness, restless legs, anxiety, depression, not enough sleep and decreased energy.  PMH:  Past Medical History  Diagnosis Date  . Pregnancy as incidental finding   . No pertinent past medical history   . ADHD (attention deficit hyperactivity disorder)   . Chronic bronchitis   . Eczema   . Headache(784.0)     miagraine  . Ovarian cyst   . Shortness of breath   . Normal pregnancy, first 02/04/2012  . SVD (spontaneous vaginal delivery) 02/05/2012    Social History:  History   Social History  . Marital Status: Single    Spouse Name: N/A    Number of Children: 2  . Years of Education: 11th   Occupational History  . na    Social History Main Topics  . Smoking status: Current Every Day Smoker -- 0.50 packs/day for 1 years    Types: Cigarettes    Last Attempt to Quit: 07/01/2011  . Smokeless tobacco: Never Used  . Alcohol Use: No  . Drug Use: No  . Sexual Activity: Yes    Birth Control/ Protection: Other-see comments   Other Topics Concern  . Not on file   Social History Narrative  . No narrative on file    Medications:   No current outpatient prescriptions on file prior to visit.   No current facility-administered medications on file prior to visit.    Allergies:   Allergies  Allergen Reactions  . Penicillins Hives and Itching  Physical Exam General: well developed, well nourished, seated, in no evident distress Head: head normocephalic and atraumatic. Orohparynx benign Neck: supple with no carotid or supraclavicular bruits Cardiovascular: regular rate and rhythm, no murmurs Musculoskeletal: no deformity Skin:  no rash/petichiae Vascular:  Normal pulses all extremities Filed Vitals:   08/15/14 1404  BP: 112/73  Pulse: 92    Neurologic Exam Mental Status: Awake and fully alert. Oriented to place and time. Recent and remote memory intact. Attention span, concentration and fund of  knowledge appropriate. Mood and affect appropriate.  Cranial Nerves: Fundoscopic exam reveals sharp disc margins. Pupils equal, briskly reactive to light. Extraocular movements full without nystagmus. Visual fields full to confrontation. Hearing intact. Facial sensation intact. Face, tongue, palate moves normally and symmetrically.  Motor: Normal bulk and tone. Normal strength in all tested extremity muscles. Sensory.: Subjective diminished touch and pinprick sensation on the right half of the face arm and part of the leg up to the thigh only. There is splitting of the midline as well as for head. Diminished subjective vibratory sensation on the right half of the body as well.  Coordination: Rapid alternating movements normal in all extremities. Finger-to-nose and heel-to-shin performed accurately bilaterally. Gait and Station: Arises from chair without difficulty. Stance is normal. Gait demonstrates normal stride length and balance . Able to heel, toe and tandem walk without difficulty.  Reflexes: 1+ and symmetric. Toes downgoing.     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 The right face and body paresthesias of unclear etiology but with normal organic pattern of sensory loss.    PLAN: I had a long discussion with the patient and her sister regarding her refractory migraine headaches, and at this rebound headache, pregnancy and the limitations of using migraine specific medications due to the pregnancy situation. I recommend she discontinue Tylenol as clearly she is taking too many tablets and they have been ineffective and causing rebound headaches. Instead start Inderal LA 60 mg daily for headache prophylaxis and take Tylenol No. 3 ,2 tablets up to 6 times the day for symptomatic relief. I encouraged her to get adequate sleep and drink plenty of fluids and be well hydrated. She also has some subjective right  hemibody  Sensory symptoms with non-organic features she may need MRI scan of the brain at some point but will consider this later in or  after her pregnancy. Return for followup in 2 months with Robin GuileLynn Lam, NP or call earlier if necessary   Note: This document was prepared with digital dictation and possible smart phrase technology. Any transcriptional errors that result from this process are unintentional.

## 2014-08-15 NOTE — Patient Instructions (Signed)
I had a long discussion with the patient and her sister regarding her refractory migraine headaches, and at this rebound headache, pregnancy and the limitations of using migraine specific medications due to the pregnancy situation. I recommend she discontinue Tylenol as clearly she is taking too many tablets and they have been ineffective and causing rebound headaches. Instead start Inderal LA 60 mg daily for headache prophylaxis and take Tylenol No. 3 2 tablets up to 6 times the day for symptomatic relief. I encouraged her to get adequate sleep and drink plenty of fluids and be well hydrated. She also has some subjective right hemibody  Sensory symptoms with non-organic features she may need MRI scan of the brain at some point but will consider this later in or  after her pregnancy. Return for followup in 2 months with Heide GuileLynn Lam, NP or call earlier if necessary  Analgesic Rebound Headaches An analgesic rebound headache is a headache that returns after pain medicine (analgesic) that was taken to treat the initial headache wears off. People who suffer from tension, migraine, or cluster headaches are at risk for developing rebound headaches. Any type of primary headache can return as a rebound headache if you regularly take analgesics more than three times a week. If the cycle of rebound headaches continues, they become chronic daily headaches.  CAUSES Analgesics frequently associated with this problem include common over-the-counter medicines like aspirin, ibuprofen, acetaminophen, sinus relief medicines, and other medicines that contain caffeine. Narcotic pain medicines are also a common cause of rebound headaches.  SIGNS AND SYMPTOMS The symptoms of rebound headaches are the same as the symptoms of your initial headache. Symptoms of specific types of headaches include: Tension headache  Pressure around the head.  Dull, aching head pain.  Pain felt over the front and sides of the head.  Tenderness in  the muscles of the head, neck and shoulders. Migraine Headache  Pulsing or throbbing pain on one or both sides of the head.  Severe pain that interferes with daily activities.  Pain that is worsened by physical activity.  Nausea, vomiting, or both.  Pain with exposure to bright light, loud noises, or strong smells.  General sensitivity to bright light, loud noises, or strong smells.  Visual changes.  Numbness of one or both arms. Cluster Headaches  Severe pain that begins in or around one eye or temple.  Redness in the eye on the same side as the pain.  Droopy or swollen eyelid.  One-sided head pain.  Nausea.  Runny nose.  Sweaty, pale facial skin.  Restlessness. DIAGNOSIS  Analgesic rebound headaches are diagnosed by reviewing your medical history. This includes the nature of your initial headaches, as well as the type of pain medicines you have been using to treat your headaches and how often you take them. TREATMENT Discontinuing frequent use of the analgesic medicine will typically reduce the frequency of the rebound episodes. This may initially worsen your headaches but eventually the pain should become more manageable, less frequent, and less severe.  Seeing a headache specialists may helpful. He or she may be able to help you manage your headaches and to make sure there is not another cause of the headaches. Alternative methods of stress relief such as acupuncture, counseling, biofeedback, and massage may also be helpful. Talk with your health care provider about which alternative treatments might be good for you. HOME CARE INSTRUCTIONS Stopping the regular use of pain medicine can be difficult. Follow your health care provider's instructions carefully. Keep  all of your appointments. Avoid triggers that are known to cause your primary headaches. SEEK MEDICAL CARE IF: You continue to experience headaches after following your health care provider's recommended  treatments. SEEK IMMEDIATE MEDICAL CARE IF:  You develop new headache pain.  You develop headache pain that is different than what you have experienced in the past.  You develop numbness or tingling in your arms or legs.  You develop changes in your speech or vision. MAKE SURE YOU:  Understand these instructions.  Will watch your child's condition.  Will get help right away if your child is not doing well or gets worse. Document Released: 03/04/2004 Document Revised: 04/29/2014 Document Reviewed: 06/28/2013 [facility] Patient Information 2015 [place], [place]. This information is not intended to replace advice given to you by your health care provider. Make sure you discuss any questions you have with your health care provider.

## 2014-10-23 ENCOUNTER — Ambulatory Visit: Payer: Self-pay | Admitting: Adult Health

## 2014-10-25 ENCOUNTER — Encounter: Payer: Self-pay | Admitting: *Deleted

## 2014-10-28 ENCOUNTER — Encounter: Payer: Self-pay | Admitting: Neurology

## 2014-11-10 ENCOUNTER — Encounter (HOSPITAL_COMMUNITY): Payer: Self-pay | Admitting: Emergency Medicine

## 2014-11-10 ENCOUNTER — Emergency Department (HOSPITAL_COMMUNITY)
Admission: EM | Admit: 2014-11-10 | Discharge: 2014-11-10 | Disposition: A | Discharge status: Home / Self Care | Payer: Medicaid Other | Attending: Emergency Medicine | Admitting: Emergency Medicine

## 2014-11-10 DIAGNOSIS — Z8742 Personal history of other diseases of the female genital tract: Secondary | ICD-10-CM | POA: Diagnosis not present

## 2014-11-10 DIAGNOSIS — Z79891 Long term (current) use of opiate analgesic: Secondary | ICD-10-CM | POA: Diagnosis not present

## 2014-11-10 DIAGNOSIS — R059 Cough, unspecified: Secondary | ICD-10-CM

## 2014-11-10 DIAGNOSIS — O99613 Diseases of the digestive system complicating pregnancy, third trimester: Secondary | ICD-10-CM | POA: Diagnosis not present

## 2014-11-10 DIAGNOSIS — Z880 Allergy status to penicillin: Secondary | ICD-10-CM | POA: Insufficient documentation

## 2014-11-10 DIAGNOSIS — J3489 Other specified disorders of nose and nasal sinuses: Secondary | ICD-10-CM | POA: Insufficient documentation

## 2014-11-10 DIAGNOSIS — Z872 Personal history of diseases of the skin and subcutaneous tissue: Secondary | ICD-10-CM | POA: Diagnosis not present

## 2014-11-10 DIAGNOSIS — Z720 Tobacco use: Secondary | ICD-10-CM | POA: Insufficient documentation

## 2014-11-10 DIAGNOSIS — O9989 Other specified diseases and conditions complicating pregnancy, childbirth and the puerperium: Secondary | ICD-10-CM | POA: Diagnosis present

## 2014-11-10 DIAGNOSIS — R05 Cough: Secondary | ICD-10-CM | POA: Diagnosis not present

## 2014-11-10 DIAGNOSIS — R0981 Nasal congestion: Secondary | ICD-10-CM | POA: Insufficient documentation

## 2014-11-10 DIAGNOSIS — Z79899 Other long term (current) drug therapy: Secondary | ICD-10-CM | POA: Insufficient documentation

## 2014-11-10 DIAGNOSIS — Z8659 Personal history of other mental and behavioral disorders: Secondary | ICD-10-CM | POA: Diagnosis not present

## 2014-11-10 DIAGNOSIS — G43909 Migraine, unspecified, not intractable, without status migrainosus: Secondary | ICD-10-CM | POA: Diagnosis not present

## 2014-11-10 DIAGNOSIS — K219 Gastro-esophageal reflux disease without esophagitis: Secondary | ICD-10-CM | POA: Insufficient documentation

## 2014-11-10 DIAGNOSIS — Z3A25 25 weeks gestation of pregnancy: Secondary | ICD-10-CM | POA: Insufficient documentation

## 2014-11-10 LAB — CBC WITH DIFFERENTIAL/PLATELET
BASOS PCT: 0 % (ref 0–1)
Basophils Absolute: 0 10*3/uL (ref 0.0–0.1)
EOS ABS: 0.2 10*3/uL (ref 0.0–0.7)
Eosinophils Relative: 1 % (ref 0–5)
HEMATOCRIT: 31.2 % — AB (ref 36.0–46.0)
Hemoglobin: 10.9 g/dL — ABNORMAL LOW (ref 12.0–15.0)
Lymphocytes Relative: 8 % — ABNORMAL LOW (ref 12–46)
Lymphs Abs: 1 10*3/uL (ref 0.7–4.0)
MCH: 30.6 pg (ref 26.0–34.0)
MCHC: 34.9 g/dL (ref 30.0–36.0)
MCV: 87.6 fL (ref 78.0–100.0)
MONO ABS: 0.5 10*3/uL (ref 0.1–1.0)
Monocytes Relative: 5 % (ref 3–12)
NEUTROS ABS: 9.9 10*3/uL — AB (ref 1.7–7.7)
Neutrophils Relative %: 86 % — ABNORMAL HIGH (ref 43–77)
Platelets: 250 10*3/uL (ref 150–400)
RBC: 3.56 MIL/uL — ABNORMAL LOW (ref 3.87–5.11)
RDW: 12.8 % (ref 11.5–15.5)
WBC: 11.6 10*3/uL — ABNORMAL HIGH (ref 4.0–10.5)

## 2014-11-10 LAB — COMPREHENSIVE METABOLIC PANEL
ALK PHOS: 83 U/L (ref 39–117)
ALT: 10 U/L (ref 0–35)
AST: 13 U/L (ref 0–37)
Albumin: 2.8 g/dL — ABNORMAL LOW (ref 3.5–5.2)
Anion gap: 14 (ref 5–15)
BUN: 5 mg/dL — AB (ref 6–23)
CALCIUM: 8.8 mg/dL (ref 8.4–10.5)
CO2: 19 mEq/L (ref 19–32)
Chloride: 100 mEq/L (ref 96–112)
Creatinine, Ser: 0.41 mg/dL — ABNORMAL LOW (ref 0.50–1.10)
Glucose, Bld: 107 mg/dL — ABNORMAL HIGH (ref 70–99)
Potassium: 3.6 mEq/L — ABNORMAL LOW (ref 3.7–5.3)
Sodium: 133 mEq/L — ABNORMAL LOW (ref 137–147)
Total Bilirubin: 0.3 mg/dL (ref 0.3–1.2)
Total Protein: 6.3 g/dL (ref 6.0–8.3)

## 2014-11-10 MED ORDER — ALUM & MAG HYDROXIDE-SIMETH 200-200-20 MG/5ML PO SUSP
30.0000 mL | Freq: Once | ORAL | Status: AC
  Administered 2014-11-10: 30 mL via ORAL
  Filled 2014-11-10: qty 30

## 2014-11-10 NOTE — ED Notes (Addendum)
Pt reports 430 this morning she started having congestion, cough, burning in abdomen and vomited x 2. Pt is [redacted] weeks pregnant.

## 2014-11-10 NOTE — Discharge Instructions (Signed)
May continue antacids as needed for continued acid reflux symptoms. Drink plenty of fluids, stay hydrated.   Follow-up with your OB-GYN. Return to the ED for new concerns.

## 2014-11-10 NOTE — ED Notes (Signed)
OB rapid response RN notified of pt.

## 2014-11-10 NOTE — ED Notes (Signed)
Ob  Monitor at bedside

## 2014-11-10 NOTE — ED Provider Notes (Signed)
CSN: [id number]     Arrival date & time 11/10/14  1634 History   First MD Initiated Contact with Patient 11/10/14 1825     Chief Complaint  Patient presents with  . Cough  . Emesis     (Consider location/radiation/quality/duration/timing/severity/associated sxs/prior Treatment) Patient is a 23 y.o. female presenting with cough and vomiting. The history is provided by the patient and medical records.  Cough Emesis   This is a 23 year old G2P1 approx [redacted] weeks gestation, presenting to the ED for cough, nasal congestion, and burning sensation in chest.  States congestion has been ongoing for the past 2 days, clear rhinorrhea, no fever, chills, sweats. States burning sensation started around 0430 this morning, some burning in her epigastrium.  She did have 2 episodes of non-bloody, non-bilious emesis after symptoms began but has tolerated PO since this time.  States she has tried drinking a milkshake and taking TUMS without relief.  No further vomiting.  Does have significant hx of heartburn with prior pregnancy.  She denies abdominal pain, cramping, vaginal bleeding, or loss of fluids.  She denies shortness of breath, palpitations, dizziness, weakness. No known cardiac hx. OB-GYN, Dr. Ellyn HackBovard.    Past Medical History  Diagnosis Date  . Pregnancy as incidental finding   . No pertinent past medical history   . ADHD (attention deficit hyperactivity disorder)   . Chronic bronchitis   . Eczema   . Headache(784.0)     miagraine  . Ovarian cyst   . Shortness of breath   . Normal pregnancy, first 02/04/2012  . SVD (spontaneous vaginal delivery) 02/05/2012   Past Surgical History  Procedure Laterality Date  . Tonsillectomy     Family History  Problem Relation Age of Onset  . Diabetes Mother   . Hyperlipidemia Mother   . Chronic bronchitis Mother   . Endometriosis Mother   . Migraines Mother   . Miscarriages [place]   . Cancer Maternal Aunt     renal  . Cancer Maternal Uncle      breast  . Mental illness Maternal Grandmother     bipolar, paranoid schizophrenia  . COPD Maternal Grandmother   . Depression Maternal Grandmother   . Heart disease Maternal Grandmother   . Hypertension Maternal Grandmother   . Hyperthyroidism Maternal Grandmother   . Cancer Maternal Grandmother     liver  . Migraines Maternal Grandmother   . Miscarriages / Stillbirths Maternal Grandmother   . Heart disease Paternal Grandmother   . Heart disease Paternal Grandfather   . Heart attack Paternal Grandfather   . Stroke Paternal Grandfather   . Heart disease Father   . Obesity Father   . Hypertension Father    History  Substance Use Topics  . Smoking status: Current Every Day Smoker -- 0.50 packs/day for 1 years    Types: Cigarettes    Last Attempt to Quit: 07/01/2011  . Smokeless tobacco: Never Used  . Alcohol Use: No   OB History    Gravida Para Term Preterm AB TAB SAB Ectopic Multiple Living   2 1 1  0 0 0 0 0 0 1     Review of Systems  HENT: Positive for congestion.   Respiratory: Positive for cough.   Gastrointestinal: Positive for vomiting.  All other systems reviewed and are negative.  Allergies  Penicillins  Home Medications   Prior to Admission medications   Medication Sig Start Date End Date Taking? Authorizing Provider  acetaminophen (TYLENOL) 325 MG tablet Take  650 mg by mouth every 6 (six) hours as needed.    Historical Provider, MD  acetaminophen-codeine (TYLENOL #3) 300-30 MG per tablet Take 2 tablets by mouth every 6 (six) hours as needed for moderate pain. 08/15/14   Delia Heady, MD  Prenatal Vit-Fe Fumarate-FA (PRENATAL MULTIVITAMIN) TABS tablet Take 1 tablet by mouth daily at 12 noon.    Historical Provider, MD  propranolol ER (INDERAL LA) 60 MG 24 hr capsule Take 1 capsule (60 mg total) by mouth daily. 08/15/14 08/15/15  Delia Heady, MD   BP 127/69 mmHg  Pulse 115  Temp(Src) 98.1 F (36.7 C) (Oral)  Resp 18  Ht 5\' 3"  (1.6 m)  Wt 160 lb (72.576  kg)  BMI 28.35 kg/m2  SpO2 97%  LMP 05/10/2014   Physical Exam  Constitutional: She is oriented to person, place, and time. She appears well-developed and well-nourished. No distress.  HENT:  Head: Normocephalic and atraumatic.  Right Ear: Tympanic membrane and ear canal normal.  Left Ear: Tympanic membrane and ear canal normal.  Nose: Rhinorrhea (clear) present.  Mouth/Throat: Uvula is midline, oropharynx is clear and moist and mucous membranes are normal. No oropharyngeal exudate, posterior oropharyngeal edema, posterior oropharyngeal erythema or tonsillar abscesses.  Moist mucous membranes; tonsils normal in appearance bilaterally without exudate; uvula midline without peritonsillar abscess; handling secretions appropriately; no difficulty swallowing or speaking  Eyes: Conjunctivae and EOM are normal. Pupils are equal, round, and reactive to light.  Neck: Normal range of motion. Neck supple.  Cardiovascular: Normal rate, regular rhythm and normal heart sounds.   Pulmonary/Chest: Effort normal and breath sounds normal. She has no decreased breath sounds. She has no wheezes. She has no rhonchi. She has no rales.  Dry cough, respirations unlabored, lungs clear, speaking in full sentences without difficulty  Abdominal: Soft. Bowel sounds are normal.  pregnant  Musculoskeletal: Normal range of motion. She exhibits no edema.  Neurological: She is alert and oriented to person, place, and time.  Skin: Skin is warm and dry. She is not diaphoretic.  Psychiatric: She has a normal mood and affect.  Nursing note and vitals reviewed.   ED Course  Procedures (including critical care time) Labs Review Labs Reviewed  CBC WITH DIFFERENTIAL - Abnormal; Notable for the following:    WBC 11.6 (*)    RBC 3.56 (*)    Hemoglobin 10.9 (*)    HCT 31.2 (*)    Neutrophils Relative % 86 (*)    Neutro Abs 9.9 (*)    Lymphocytes Relative 8 (*)    All other components within normal limits  COMPREHENSIVE  METABOLIC PANEL - Abnormal; Notable for the following:    Sodium 133 (*)    Potassium 3.6 (*)    Glucose, Bld 107 (*)    BUN 5 (*)    Creatinine, Ser 0.41 (*)    Albumin 2.8 (*)    All other components within normal limits    Imaging Review No results found.   EKG Interpretation None      MDM   Final diagnoses:  Nasal congestion  Cough  Gastroesophageal reflux disease without esophagitis   23 year old G2 P1 at [redacted] weeks gestation presenting to the ED for cough, nasal congestion and burning sensation in her chest. She has had 2 episodes of nonbloody, nonbilious emesis this morning but has tolerated PO since this time.  She denies current abdominal pain, vaginal bleeding, or loss of fluid. She does have history of heartburn with prior pregnancy.  On  exam, patient afebrile and overall nontoxic in appearance. Her exam is overall noninfectious.  Lungs are clear bilaterally without wheezes or rhonchi to suggest CAP.  Low suspicion for ACS, PE, dissection, or other acute cardiac event. Suspect that her burning chest pain is due to acid reflux.  Labs were obtained in triage which are reassuring.  Will give dose of maalox.  Rapid OB paged to evaluate patient at bedside.  Patient given maalox without resolution of burning sensation in her chest however she is currently drinking milkshake without difficulty.  FHR 150's, continues to deny abdominal pain.  Sx likely related to viral URI and GERD.  VS remain stable on RA.  Feel patient stable for discharge.  Will FU with OB-GYN.  Encouraged may continue OTC TUMS or Maalox as needed for GERD.  Discussed plan with patient, he/she acknowledged understanding and agreed with plan of care.  Return precautions given for new or worsening symptoms.  Case discussed with attending physician, Dr. Patria Maneampos, who agrees with assessment and plan of care.  Garlon HatchetLisa M Dragon Thrush, PA-C 11/10/14 2303  Lyanne CoKevin M Campos, MD 11/13/14 [phone]

## 2014-12-27 NOTE — L&D Delivery Note (Signed)
Delivery Note Pt had SROM and was complete and began pushing.  When I arrived for delivery I could see a little bit of the head.  As I was putting on my gloves, she began to have another ctx, her mom encouraged her to push despite the fact that the foley catheter was still in place and I did not yet have my gloves on.  RN delivered the baby.  At 7:02 PM a viable female was delivered via Vaginal, Spontaneous Delivery (Presentation: ; Occiput Anterior).  APGAR: 9, 9; weight  pending.   Placenta status: Intact, Spontaneous.  Cord:  with the following complications: None.  Anesthesia: None  Episiotomy: None Lacerations: 2nd degree;Perineal Suture Repair: 3.0 vicryl rapide Est. Blood Loss (mL): 300  Mom to postpartum.  Baby to Couplet care / Skin to Skin.  Andria Head D 02/16/2015, 7:29 PM

## 2015-01-21 LAB — OB RESULTS CONSOLE GBS: GBS: NEGATIVE

## 2015-02-12 ENCOUNTER — Encounter (HOSPITAL_COMMUNITY): Payer: Self-pay | Admitting: *Deleted

## 2015-02-12 ENCOUNTER — Telehealth (HOSPITAL_COMMUNITY): Payer: Self-pay | Admitting: *Deleted

## 2015-02-12 NOTE — Telephone Encounter (Signed)
Preadmission screen  

## 2015-02-14 ENCOUNTER — Inpatient Hospital Stay (HOSPITAL_COMMUNITY)
Admission: AD | Admit: 2015-02-14 | Discharge: 2015-02-14 | Disposition: A | Discharge status: Home / Self Care | Payer: Medicaid Other | Source: Ambulatory Visit | Attending: Obstetrics and Gynecology | Admitting: Obstetrics and Gynecology

## 2015-02-14 ENCOUNTER — Encounter (HOSPITAL_COMMUNITY): Payer: Self-pay | Admitting: *Deleted

## 2015-02-14 DIAGNOSIS — Z3A38 38 weeks gestation of pregnancy: Secondary | ICD-10-CM

## 2015-02-14 DIAGNOSIS — O471 False labor at or after 37 completed weeks of gestation: Secondary | ICD-10-CM | POA: Insufficient documentation

## 2015-02-14 DIAGNOSIS — O99333 Smoking (tobacco) complicating pregnancy, third trimester: Secondary | ICD-10-CM | POA: Diagnosis not present

## 2015-02-14 DIAGNOSIS — O4693 Antepartum hemorrhage, unspecified, third trimester: Secondary | ICD-10-CM | POA: Diagnosis present

## 2015-02-14 DIAGNOSIS — F17210 Nicotine dependence, cigarettes, uncomplicated: Secondary | ICD-10-CM | POA: Diagnosis not present

## 2015-02-14 DIAGNOSIS — O2343 Unspecified infection of urinary tract in pregnancy, third trimester: Secondary | ICD-10-CM | POA: Insufficient documentation

## 2015-02-14 LAB — URINALYSIS, ROUTINE W REFLEX MICROSCOPIC
Bilirubin Urine: NEGATIVE
GLUCOSE, UA: NEGATIVE mg/dL
HGB URINE DIPSTICK: NEGATIVE
KETONES UR: NEGATIVE mg/dL
NITRITE: NEGATIVE
PH: 6 (ref 5.0–8.0)
PROTEIN: NEGATIVE mg/dL
SPECIFIC GRAVITY, URINE: 1.02 (ref 1.005–1.030)
UROBILINOGEN UA: 0.2 mg/dL (ref 0.0–1.0)

## 2015-02-14 LAB — URINE MICROSCOPIC-ADD ON

## 2015-02-14 NOTE — MAU Provider Note (Signed)
History     CSN: [id number]  Arrival date and time: 02/14/15 1335   First Provider Initiated Contact with Patient 02/14/15 1436      Chief Complaint  Patient presents with  . Abdominal Cramping  . Vaginal Bleeding   HPI 23 y.o.G2P1001.@[redacted]w[redacted]d  presents to the MAU stating that she had some bright red bleeding when she went to the bathroom and wiped. None since that time. She states that she has been cramping since that time. She denies LOF and reports positive fetal movement.     Past Medical History  Diagnosis Date  . Pregnancy as incidental finding   . No pertinent past medical history   . ADHD (attention deficit hyperactivity disorder)   . Chronic bronchitis   . Eczema   . Headache(784.0)     miagraine  . Ovarian cyst   . Shortness of breath   . Normal pregnancy, first 02/04/2012  . SVD (spontaneous vaginal delivery) 02/05/2012    Past Surgical History  Procedure Laterality Date  . Tonsillectomy    . Wisdom tooth extraction      Family History  Problem Relation Age of Onset  . Diabetes Mother   . Hyperlipidemia Mother   . Chronic bronchitis Mother   . Endometriosis Mother   . Migraines Mother   . Miscarriages / [place] Mother   . Cancer Maternal Aunt     renal  . Cancer Maternal Uncle     breast  . Mental illness Maternal Grandmother     bipolar, paranoid schizophrenia  . COPD Maternal Grandmother   . Depression Maternal Grandmother   . Heart disease Maternal Grandmother   . Hypertension Maternal Grandmother   . Hyperthyroidism Maternal Grandmother   . Cancer Maternal Grandmother     liver  . Migraines Maternal Grandmother   . Miscarriages / Stillbirths Maternal Grandmother   . Heart disease Paternal Grandmother   . Heart disease Paternal Grandfather   . Heart attack Paternal Grandfather   . Stroke Paternal Grandfather   . Heart disease Father   . Obesity Father   . Hypertension Father     History  Substance Use Topics  . Smoking status:  Current Every Day Smoker -- 0.50 packs/day for 1 years    Types: Cigarettes    Last Attempt to Quit: 07/01/2011  . Smokeless tobacco: Never Used  . Alcohol Use: No    Allergies:  Allergies  Allergen Reactions  . Penicillins Hives and Itching    Prescriptions prior to admission  Medication Sig Dispense Refill Last Dose  . Prenatal Vit-Fe Fumarate-FA (PRENATAL MULTIVITAMIN) TABS tablet Take 1 tablet by mouth daily at 12 noon.   Past Week at Unknown time  . zolpidem (AMBIEN) 5 MG tablet Take 5 mg by mouth at bedtime as needed for sleep.   more than one month    Review of Systems  Eyes: Negative for blurred vision.  Gastrointestinal: Positive for abdominal pain. Negative for nausea and vomiting.       Contractions  Genitourinary: Positive for hematuria. Negative for dysuria, urgency and frequency.  Neurological: Negative for headaches.  Psychiatric/Behavioral: Negative for depression.   Physical Exam   Blood pressure 128/73, pulse 98, temperature 98.7 F (37.1 C), temperature source Oral, resp. rate 18, weight 84.823 kg (187 lb), last menstrual period 05/31/2014.  Physical Exam  Constitutional: She is oriented to person, place, and time. She appears well-developed and well-nourished. No distress.  HENT:  Head: Normocephalic and atraumatic.  Neck: Normal range of  motion.  Cardiovascular: Normal rate.   Respiratory: Effort normal. No respiratory distress.  GI: Soft. She exhibits no distension and no mass. There is no tenderness. There is no rebound and no guarding.  Genitourinary: Vagina normal. There is no rash, tenderness, lesion or injury on the right labia. There is no rash, tenderness, lesion or injury on the left labia. No erythema, tenderness or bleeding in the vagina. No foreign body around the vagina. No signs of injury around the vagina. No vaginal discharge found.  Musculoskeletal: Normal range of motion. She exhibits no edema.  Neurological: She is alert and oriented  to person, place, and time.  Skin: Skin is warm and dry.  Psychiatric: She has a normal mood and affect. Her behavior is normal. Judgment and thought content normal.    MAU Course  Procedures UA Results for orders placed or performed during the hospital encounter of 02/14/15 (from the past 24 hour(s))  Urinalysis, Routine w reflex microscopic     Status: Abnormal   Collection Time: 02/14/15  2:55 PM  Result Value Ref Range   Color, Urine YELLOW YELLOW   APPearance HAZY (A) CLEAR   Specific Gravity, Urine 1.020 1.005 - 1.030   pH 6.0 5.0 - 8.0   Glucose, UA NEGATIVE NEGATIVE mg/dL   Hgb urine dipstick NEGATIVE NEGATIVE   Bilirubin Urine NEGATIVE NEGATIVE   Ketones, ur NEGATIVE NEGATIVE mg/dL   Protein, ur NEGATIVE NEGATIVE mg/dL   Urobilinogen, UA 0.2 0.0 - 1.0 mg/dL   Nitrite NEGATIVE NEGATIVE   Leukocytes, UA MODERATE (A) NEGATIVE  Urine microscopic-add on     Status: Abnormal   Collection Time: 02/14/15  2:55 PM  Result Value Ref Range   Squamous Epithelial / LPF MANY (A) RARE   WBC, UA 11-20 <3 WBC/hpf   RBC / HPF 3-6 <3 RBC/hpf   Bacteria, UA MANY (A) RARE     Assessment and Plan  A: UTI     Braxton Hicks Contractions P: Discussed labs, reactive FHR, VE.with Dr Jackelyn KnifeMeisinger-      Discharge patient to home      Send urine for culture  [facility] Surgery CenterClemmons,Lori Grissett 02/14/2015, 3:40 PM

## 2015-02-14 NOTE — Discharge Instructions (Signed)
Braxton Hicks Contractions °Contractions of the uterus can occur throughout pregnancy. Contractions are not always a sign that you are in labor.  °WHAT ARE BRAXTON HICKS CONTRACTIONS?  °Contractions that occur before labor are called Braxton Hicks contractions, or false labor. Toward the end of pregnancy (32-34 weeks), these contractions can develop more often and may become more forceful. This is not true labor because these contractions do not result in opening (dilatation) and thinning of the cervix. They are sometimes difficult to tell apart from true labor because these contractions can be forceful and people have different pain tolerances. You should not feel embarrassed if you go to the hospital with false labor. Sometimes, the only way to tell if you are in true labor is for your health care provider to look for changes in the cervix. °If there are no prenatal problems or other health problems associated with the pregnancy, it is completely safe to be sent home with false labor and await the onset of true labor. °HOW CAN YOU TELL THE DIFFERENCE BETWEEN TRUE AND FALSE LABOR? °False Labor °· The contractions of false labor are usually shorter and not as hard as those of true labor.   °· The contractions are usually irregular.   °· The contractions are often felt in the front of the lower abdomen and in the groin.   °· The contractions may go away when you walk around or change positions while lying down.   °· The contractions get weaker and are shorter lasting as time goes on.   °· The contractions do not usually become progressively stronger, regular, and closer together as with true labor.   °True Labor °· Contractions in true labor last 30-70 seconds, become very regular, usually become more intense, and increase in frequency.   °· The contractions do not go away with walking.   °· The discomfort is usually felt in the top of the uterus and spreads to the lower abdomen and low back.   °· True labor can be  determined by your health care provider with an exam. This will show that the cervix is dilating and getting thinner.   °WHAT TO REMEMBER °· Keep up with your usual exercises and follow other instructions given by your health care provider.   °· Take medicines as directed by your health care provider.   °· Keep your regular prenatal appointments.   °· Eat and drink lightly if you think you are going into labor.   °· If Braxton Hicks contractions are making you uncomfortable:   °¨ Change your position from lying down or resting to walking, or from walking to resting.   °¨ Sit and rest in a tub of warm water.   °¨ Drink 2-3 glasses of water. Dehydration may cause these contractions.   °¨ Do slow and deep breathing several times an hour.   °WHEN SHOULD I SEEK IMMEDIATE MEDICAL CARE? °Seek immediate medical care if: °· Your contractions become stronger, more regular, and closer together.   °· You have fluid leaking or gushing from your vagina.   °· You have a fever.   °· You pass blood-tinged mucus.   °· You have vaginal bleeding.   °· You have continuous abdominal pain.   °· You have low back pain that you never had before.   °· You feel your baby's head pushing down and causing pelvic pressure.   °· Your baby is not moving as much as it used to.   °Document Released: 12/13/2005 Document Revised: 12/18/2013 Document Reviewed: 09/24/2013 °[employer]® Patient Information ©2015 [employer]. This information is not intended to replace advice given to you by your health care   provider. Make sure you discuss any questions you have with your health care provider. ° °

## 2015-02-14 NOTE — MAU Note (Addendum)
When went to bathroom and peed, there was a lot of blood in the toilet and on the paper. Then started cramping. Scheduled for induction next tues. No hx of low lying placenta or previa. Was 2 cm on Tues. No recent intercourse. Baby is moving.

## 2015-02-15 NOTE — Progress Notes (Signed)
FHT from 2-19 reviewed.  Reactive NST, no significant decels, irreg ctx.

## 2015-02-16 ENCOUNTER — Inpatient Hospital Stay (HOSPITAL_COMMUNITY): Payer: Medicaid Other | Admitting: Anesthesiology

## 2015-02-16 ENCOUNTER — Inpatient Hospital Stay (HOSPITAL_COMMUNITY)
Admission: AD | Admit: 2015-02-16 | Discharge: 2015-02-18 | DRG: 775 | Disposition: A | Discharge status: Home / Self Care | Payer: Medicaid Other | Source: Ambulatory Visit | Attending: Obstetrics and Gynecology | Admitting: Obstetrics and Gynecology

## 2015-02-16 ENCOUNTER — Encounter (HOSPITAL_COMMUNITY): Payer: Self-pay

## 2015-02-16 DIAGNOSIS — F17210 Nicotine dependence, cigarettes, uncomplicated: Secondary | ICD-10-CM | POA: Diagnosis present

## 2015-02-16 DIAGNOSIS — Z3A38 38 weeks gestation of pregnancy: Secondary | ICD-10-CM | POA: Diagnosis present

## 2015-02-16 DIAGNOSIS — IMO0001 Reserved for inherently not codable concepts without codable children: Secondary | ICD-10-CM

## 2015-02-16 DIAGNOSIS — Z8249 Family history of ischemic heart disease and other diseases of the circulatory system: Secondary | ICD-10-CM | POA: Diagnosis not present

## 2015-02-16 DIAGNOSIS — Z833 Family history of diabetes mellitus: Secondary | ICD-10-CM | POA: Diagnosis not present

## 2015-02-16 DIAGNOSIS — O99334 Smoking (tobacco) complicating childbirth: Secondary | ICD-10-CM | POA: Diagnosis present

## 2015-02-16 DIAGNOSIS — Z823 Family history of stroke: Secondary | ICD-10-CM | POA: Diagnosis not present

## 2015-02-16 DIAGNOSIS — O471 False labor at or after 37 completed weeks of gestation: Secondary | ICD-10-CM | POA: Diagnosis present

## 2015-02-16 HISTORY — DX: Unspecified chronic bronchitis: J42

## 2015-02-16 LAB — TYPE AND SCREEN
ABO/RH(D): O POS
ANTIBODY SCREEN: NEGATIVE

## 2015-02-16 LAB — CULTURE, OB URINE: Colony Count: 50000

## 2015-02-16 LAB — CBC
HEMATOCRIT: 33 % — AB (ref 36.0–46.0)
Hemoglobin: 10.7 g/dL — ABNORMAL LOW (ref 12.0–15.0)
MCH: 26 pg (ref 26.0–34.0)
MCHC: 32.4 g/dL (ref 30.0–36.0)
MCV: 80.1 fL (ref 78.0–100.0)
Platelets: 316 10*3/uL (ref 150–400)
RBC: 4.12 MIL/uL (ref 3.87–5.11)
RDW: 13.3 % (ref 11.5–15.5)
WBC: 17.4 10*3/uL — AB (ref 4.0–10.5)

## 2015-02-16 MED ORDER — SENNOSIDES-DOCUSATE SODIUM 8.6-50 MG PO TABS
2.0000 | ORAL_TABLET | ORAL | Status: DC
  Administered 2015-02-17 (×2): 2 via ORAL
  Filled 2015-02-16 (×3): qty 2

## 2015-02-16 MED ORDER — ZOLPIDEM TARTRATE 5 MG PO TABS: 5.0000 mg | ORAL_TABLET | Freq: Every evening | ORAL | Status: DC | PRN

## 2015-02-16 MED ORDER — ONDANSETRON HCL 4 MG/2ML IJ SOLN: 4.0000 mg | INTRAMUSCULAR | Status: DC | PRN

## 2015-02-16 MED ORDER — LACTATED RINGERS IV SOLN
500.0000 mL | Freq: Once | INTRAVENOUS | Status: AC
  Administered 2015-02-16: 500 mL via INTRAVENOUS

## 2015-02-16 MED ORDER — OXYTOCIN 40 UNITS IN LACTATED RINGERS INFUSION - SIMPLE MED
1.0000 m[IU]/min | INTRAVENOUS | Status: DC
  Administered 2015-02-16: 2 m[IU]/min via INTRAVENOUS
  Filled 2015-02-16: qty 1000

## 2015-02-16 MED ORDER — OXYCODONE-ACETAMINOPHEN 5-325 MG PO TABS: 1.0000 | ORAL_TABLET | ORAL | Status: DC | PRN

## 2015-02-16 MED ORDER — BENZOCAINE-MENTHOL 20-0.5 % EX AERO
1.0000 "application " | INHALATION_SPRAY | CUTANEOUS | Status: DC | PRN
  Administered 2015-02-16 – 2015-02-18 (×2): 1 via TOPICAL
  Filled 2015-02-16 (×2): qty 56

## 2015-02-16 MED ORDER — PRENATAL MULTIVITAMIN CH
1.0000 | ORAL_TABLET | Freq: Every day | ORAL | Status: DC
  Administered 2015-02-17 – 2015-02-18 (×2): 1 via ORAL
  Filled 2015-02-16 (×2): qty 1

## 2015-02-16 MED ORDER — MAGNESIUM HYDROXIDE 400 MG/5ML PO SUSP: 30.0000 mL | ORAL | Status: DC | PRN

## 2015-02-16 MED ORDER — CITRIC ACID-SODIUM CITRATE 334-500 MG/5ML PO SOLN
30.0000 mL | ORAL | Status: DC | PRN
  Administered 2015-02-16: 30 mL via ORAL
  Filled 2015-02-16: qty 15

## 2015-02-16 MED ORDER — BUTORPHANOL TARTRATE 1 MG/ML IJ SOLN
1.0000 mg | INTRAMUSCULAR | Status: DC | PRN
Start: 2015-02-16 — End: 2015-02-16

## 2015-02-16 MED ORDER — LIDOCAINE HCL (PF) 1 % IJ SOLN
30.0000 mL | INTRAMUSCULAR | Status: DC | PRN
Start: 2015-02-16 — End: 2015-02-16
  Filled 2015-02-16: qty 30

## 2015-02-16 MED ORDER — DIBUCAINE 1 % RE OINT: 1.0000 "application " | TOPICAL_OINTMENT | RECTAL | Status: DC | PRN

## 2015-02-16 MED ORDER — FENTANYL 2.5 MCG/ML BUPIVACAINE 1/10 % EPIDURAL INFUSION (WH - ANES)
14.0000 mL/h | INTRAMUSCULAR | Status: DC | PRN
  Administered 2015-02-16: 14 mL/h via EPIDURAL
  Filled 2015-02-16: qty 125

## 2015-02-16 MED ORDER — WITCH HAZEL-GLYCERIN EX PADS: 1.0000 "application " | MEDICATED_PAD | CUTANEOUS | Status: DC | PRN

## 2015-02-16 MED ORDER — ONDANSETRON HCL 4 MG PO TABS: 4.0000 mg | ORAL_TABLET | ORAL | Status: DC | PRN

## 2015-02-16 MED ORDER — ONDANSETRON HCL 4 MG/2ML IJ SOLN: 4.0000 mg | Freq: Four times a day (QID) | INTRAMUSCULAR | Status: DC | PRN

## 2015-02-16 MED ORDER — LACTATED RINGERS IV SOLN: 500.0000 mL | INTRAVENOUS | Status: DC | PRN

## 2015-02-16 MED ORDER — EPHEDRINE 5 MG/ML INJ
10.0000 mg | INTRAVENOUS | Status: DC | PRN
  Filled 2015-02-16: qty 2

## 2015-02-16 MED ORDER — MEASLES, MUMPS & RUBELLA VAC ~~LOC~~ INJ: 0.5000 mL | INJECTION | Freq: Once | SUBCUTANEOUS | Status: DC

## 2015-02-16 MED ORDER — IBUPROFEN 600 MG PO TABS
600.0000 mg | ORAL_TABLET | Freq: Four times a day (QID) | ORAL | Status: DC
  Administered 2015-02-16 – 2015-02-18 (×7): 600 mg via ORAL
  Filled 2015-02-16 (×7): qty 1

## 2015-02-16 MED ORDER — OXYTOCIN BOLUS FROM INFUSION: 500.0000 mL | INTRAVENOUS | Status: DC

## 2015-02-16 MED ORDER — SIMETHICONE 80 MG PO CHEW: 80.0000 mg | CHEWABLE_TABLET | ORAL | Status: DC | PRN

## 2015-02-16 MED ORDER — LACTATED RINGERS IV SOLN
INTRAVENOUS | Status: DC
  Administered 2015-02-16: 17:00:00 via INTRAVENOUS

## 2015-02-16 MED ORDER — OXYCODONE-ACETAMINOPHEN 5-325 MG PO TABS: 2.0000 | ORAL_TABLET | ORAL | Status: DC | PRN

## 2015-02-16 MED ORDER — PHENYLEPHRINE 40 MCG/ML (10ML) SYRINGE FOR IV PUSH (FOR BLOOD PRESSURE SUPPORT)
80.0000 ug | PREFILLED_SYRINGE | INTRAVENOUS | Status: DC | PRN
  Filled 2015-02-16: qty 2

## 2015-02-16 MED ORDER — TERBUTALINE SULFATE 1 MG/ML IJ SOLN
0.2500 mg | Freq: Once | INTRAMUSCULAR | Status: DC | PRN
  Filled 2015-02-16: qty 1

## 2015-02-16 MED ORDER — METHYLERGONOVINE MALEATE 0.2 MG PO TABS: 0.2000 mg | ORAL_TABLET | ORAL | Status: DC | PRN

## 2015-02-16 MED ORDER — DIPHENHYDRAMINE HCL 50 MG/ML IJ SOLN
12.5000 mg | INTRAMUSCULAR | Status: DC | PRN
  Administered 2015-02-16 (×2): 12.5 mg via INTRAVENOUS
  Filled 2015-02-16: qty 1

## 2015-02-16 MED ORDER — METHYLERGONOVINE MALEATE 0.2 MG/ML IJ SOLN: 0.2000 mg | INTRAMUSCULAR | Status: DC | PRN

## 2015-02-16 MED ORDER — DIPHENHYDRAMINE HCL 25 MG PO CAPS: 25.0000 mg | ORAL_CAPSULE | Freq: Four times a day (QID) | ORAL | Status: DC | PRN

## 2015-02-16 MED ORDER — LIDOCAINE HCL (PF) 1 % IJ SOLN
INTRAMUSCULAR | Status: DC | PRN
  Administered 2015-02-16 (×2): 5 mL
  Administered 2015-02-16: 3 mL

## 2015-02-16 MED ORDER — OXYCODONE-ACETAMINOPHEN 5-325 MG PO TABS
1.0000 | ORAL_TABLET | ORAL | Status: DC | PRN
  Administered 2015-02-17 (×3): 1 via ORAL
  Filled 2015-02-16 (×3): qty 1

## 2015-02-16 MED ORDER — LANOLIN HYDROUS EX OINT
TOPICAL_OINTMENT | CUTANEOUS | Status: DC | PRN
  Administered 2015-02-18: 05:00:00 via TOPICAL

## 2015-02-16 MED ORDER — OXYCODONE-ACETAMINOPHEN 5-325 MG PO TABS
2.0000 | ORAL_TABLET | ORAL | Status: DC | PRN
  Administered 2015-02-17 – 2015-02-18 (×4): 2 via ORAL
  Filled 2015-02-16 (×4): qty 2

## 2015-02-16 MED ORDER — TETANUS-DIPHTH-ACELL PERTUSSIS 5-2.5-18.5 LF-MCG/0.5 IM SUSP: 0.5000 mL | Freq: Once | INTRAMUSCULAR | Status: DC

## 2015-02-16 MED ORDER — OXYTOCIN 40 UNITS IN LACTATED RINGERS INFUSION - SIMPLE MED
62.5000 mL/h | INTRAVENOUS | Status: DC
  Administered 2015-02-16: 62.5 mL/h via INTRAVENOUS

## 2015-02-16 MED ORDER — PHENYLEPHRINE 40 MCG/ML (10ML) SYRINGE FOR IV PUSH (FOR BLOOD PRESSURE SUPPORT)
80.0000 ug | PREFILLED_SYRINGE | INTRAVENOUS | Status: DC | PRN
  Filled 2015-02-16: qty 2
  Filled 2015-02-16: qty 20

## 2015-02-16 MED ORDER — FENTANYL 2.5 MCG/ML BUPIVACAINE 1/10 % EPIDURAL INFUSION (WH - ANES)
INTRAMUSCULAR | Status: DC | PRN
  Administered 2015-02-16: 14 mL/h via EPIDURAL

## 2015-02-16 NOTE — Anesthesia Procedure Notes (Signed)
Epidural Patient location during procedure: OB  Staffing Anesthesiologist: Azad Calame Performed by: anesthesiologist   Preanesthetic Checklist Completed: patient identified, site marked, surgical consent, pre-op evaluation, timeout performed, IV checked, risks and benefits discussed and monitors and equipment checked  Epidural Patient position: sitting Prep: ChloraPrep Patient monitoring: heart rate, continuous pulse ox and blood pressure Approach: right paramedian Location: L3-L4 Injection technique: LOR saline  Needle:  Needle type: Tuohy  Needle gauge: 17 G Needle length: 9 cm and 9 Needle insertion depth: 5 cm Catheter type: closed end flexible Catheter size: 20 Guage Catheter at skin depth: 10 cm Test dose: negative  Assessment Events: blood not aspirated, injection not painful, no injection resistance, negative IV test and no paresthesia  Additional Notes   Patient tolerated the insertion well without complications.   

## 2015-02-16 NOTE — H&P (Signed)
Robin [name] is a 24 y.o. female, G2 P1001, EGA 38+ weeks with EDC 2-29 presenting for ctx.  Eval in MAU with reg ctx, VE 3 cm dilated.  Pt admitted and received an epidural, then started on pitocin augmentation as ctx spaced out.  Membranes did not rupture until just prior to delivery.  Prenatal care essentially uncomplicated, see prenatal records for complete history.  Maternal Medical History:  Reason for admission: Contractions.   Contractions: Frequency: regular.   Perceived severity is strong.    Fetal activity: Perceived fetal activity is normal.      OB History    Gravida Para Term Preterm AB TAB SAB Ectopic Multiple Living   2 2 2  0 0 0 0 0 0 2     Past Medical History  Diagnosis Date  . Pregnancy as incidental finding   . No pertinent past medical history   . ADHD (attention deficit hyperactivity disorder)   . Chronic bronchitis   . Eczema   . Headache(784.0)     miagraine  . Ovarian cyst   . Shortness of breath   . Normal pregnancy, first 02/04/2012  . SVD (spontaneous vaginal delivery) 02/05/2012  . Chronic bronchitis     2x a year. last episode 2.5 years ago   Past Surgical History  Procedure Laterality Date  . Tonsillectomy    . Wisdom tooth extraction     Family History: family history includes COPD in her maternal grandmother; Cancer in her maternal aunt, maternal grandmother, and maternal uncle; Chronic bronchitis in her mother; Depression in her maternal grandmother; Diabetes in her mother; Endometriosis in her mother; Heart attack in her paternal grandfather; Heart disease in her father, maternal grandmother, paternal grandfather, and paternal grandmother; Hyperlipidemia in her mother; Hypertension in her father and maternal grandmother; Hyperthyroidism in her maternal grandmother; Mental illness in her maternal grandmother; Migraines in her maternal grandmother and mother; Miscarriages / Stillbirths in her maternal grandmother and mother; Obesity in her  father; Stroke in her paternal grandfather. Social History:  reports that she has been smoking Cigarettes.  She has a .5 pack-year smoking history. She has never used smokeless tobacco. She reports that she does not drink alcohol or use illicit drugs.   Prenatal Transfer Tool  Maternal Diabetes: No Genetic Screening: Normal Maternal Ultrasounds/Referrals: Normal Fetal Ultrasounds or other Referrals:  None Maternal Substance Abuse:  No Significant Maternal Medications:  None Significant Maternal Lab Results:  Lab values include: Group B Strep negative Other Comments:  None  Review of Systems  Respiratory: Negative.   Cardiovascular: Negative.     Dilation: 10 Effacement (%): 100 Station: +1, +2 Exam by:: patti moore rn Blood pressure 134/78, pulse 111, temperature 98.3 F (36.8 C), temperature source Oral, resp. rate 18, height 5\' 3"  (1.6 m), weight 83.462 kg (184 lb), last menstrual period 05/31/2014, SpO2 99 %, unknown if currently breastfeeding. Maternal Exam:  Uterine Assessment: Contraction strength is moderate.  Contraction frequency is regular.   Abdomen: Patient reports no abdominal tenderness. Estimated fetal weight is 7 1/2 lbs.   Fetal presentation: vertex  Introitus: Normal vulva. Normal vagina.  Amniotic fluid character: clear.  Pelvis: adequate for delivery.   Cervix: Cervix evaluated by digital exam.     Fetal Exam Fetal Monitor Review: Mode: ultrasound.   Variability: moderate (6-25 bpm).   Pattern: accelerations present and no decelerations.    Fetal State Assessment: Category I - tracings are normal.     Physical Exam  Vitals reviewed. Constitutional: She  appears well-developed and well-nourished.  Cardiovascular: Normal rate, regular rhythm and normal heart sounds.   No murmur heard. Respiratory: Effort normal and breath sounds normal. No respiratory distress.  GI: Soft.    Prenatal labs: ABO, Rh: --/--/O POS (02/21 1320) Antibody: NEG (02/21  1320) Rubella: Immune (08/05 0000) RPR: Nonreactive (08/05 0000)  HBsAg: Negative (08/05 0000)  HIV: Non-reactive (08/05 0000)  GBS: Negative (01/26 0000)   Assessment/Plan: IUP at 38+ weeks in early labor, required pitocin augmentation.  See delivery note.   Daune Colgate D 02/16/2015, 7:25 PM

## 2015-02-16 NOTE — MAU Note (Signed)
[facility] Will start IV and call back. Will go to room 168

## 2015-02-16 NOTE — Anesthesia Preprocedure Evaluation (Signed)
Anesthesia Evaluation  Patient identified by MRN, date of birth, ID band Patient awake    Reviewed: Allergy & Precautions, H&P , NPO status , Patient's Chart, lab work & pertinent test results  History of Anesthesia Complications Negative for: history of anesthetic complications  Airway Mallampati: II TM Distance: >3 FB Neck ROM: full    Dental no notable dental hx. (+) Teeth Intact   Pulmonary neg pulmonary ROS, Current Smoker,  breath sounds clear to auscultation  Pulmonary exam normal       Cardiovascular negative cardio ROS  Rhythm:regular Rate:Normal     Neuro/Psych negative neurological ROS  negative psych ROS   GI/Hepatic negative GI ROS, Neg liver ROS,   Endo/Other  negative endocrine ROS  Renal/GU negative Renal ROS  negative genitourinary   Musculoskeletal   Abdominal Normal abdominal exam  (+)   Peds  Hematology negative hematology ROS (+)   Anesthesia Other Findings   Reproductive/Obstetrics (+) Pregnancy                           Anesthesia Physical Anesthesia Plan  ASA: II  Anesthesia Plan: Epidural   Post-op Pain Management:    Induction:   Airway Management Planned:   Additional Equipment:   Intra-op Plan:   Post-operative Plan:   Informed Consent: I have reviewed the patients History and Physical, chart, labs and discussed the procedure including the risks, benefits and alternatives for the proposed anesthesia with the patient or authorized representative who has indicated his/her understanding and acceptance.     Plan Discussed with:   Anesthesia Plan Comments:         Anesthesia Quick Evaluation  

## 2015-02-16 NOTE — MAU Note (Signed)
Pt to 168 per Best BuyHeather RN

## 2015-02-16 NOTE — MAU Note (Signed)
Pt presents complaining of contractions less than 4 minutes apart. Denies leaking of fluid and vaginal bleeding. Reports decreased fetal movement since contractions started last pm

## 2015-02-16 NOTE — Progress Notes (Signed)
Notified of pt arrival in MAU, cervical exam, uterine activity and discomfort level. Dr [facility]

## 2015-02-17 LAB — RPR: RPR Ser Ql: NONREACTIVE

## 2015-02-17 NOTE — Lactation Note (Signed)
This note was copied from the chart of Robin [name] XXXMedford. Lactation Consultation Note Mom has 3 yr. Old whom she attempted to BF after while in hospital. Had difficulty latching and became frustrated and started formula feeding. Mom has short shaft nipples that compresses inwards making latching difficult. Hand expression demonstrated w/good flow of colostrum. Fitted mom w/#20NS, assisted mom in latching baby. Baby played around w/a few suckles. Did lots of STS. Hand expressed 5 ml colostrum and gave in syring w/gloves finger w/suck training/stimulation. Baby bites some, and after stimulated finally suckled. Mom stated baby had been gagy. Encouraged strict I&O since baby 6.lbs. Shells given to mom to wear to assist her nipple in everting out for deeper latch. Hand pump given to evert nipple prior to latching and application of NS, and stimulation of breast if baby doesn't BF.  Mom encouraged to feed baby 8-12 times/24 hours and with feeding cues. Mom encouraged to waken baby for feeds.  Educated about newborn behavior. Mom encouraged to do skin-to-skin.Referred to Baby and Me Book in Breastfeeding section Pg. 22-23 for position options and Proper latch demonstration.[facility]/LC brochure given w/resources, support groups and LC services. Patient Name: Robin [name] XXXMedford SJGGE'ZToday's Date: 02/17/2015 Reason for consult: Initial assessment   Maternal Data Has patient been taught Hand Expression?: Yes Does the patient have breastfeeding experience prior to this delivery?: Yes  Feeding Feeding Type: Breast Milk Length of feed: 2 min  LATCH Score/Interventions Latch: Too sleepy or reluctant, no latch achieved, no sucking elicited. Intervention(s): Skin to skin;Teach feeding cues;Waking techniques  Audible Swallowing: None Intervention(s): Skin to skin;Hand expression  Type of Nipple: Everted at rest and after stimulation (compresses inwards when compressed)  Comfort (Breast/Nipple): Soft /  non-tender     Hold (Positioning): Assistance needed to correctly position infant at breast and maintain latch. Intervention(s): Breastfeeding basics reviewed;Support Pillows;Position options;Skin to skin  LATCH Score: 5  Lactation Tools Discussed/Used Tools: Pump;Nipple Dorris CarnesShields;Shells Nipple shield size: 20;24 Shell Type: Inverted Breast pump type: Manual Pump Review: Setup, frequency, and cleaning;Milk Storage Initiated by:: Peri JeffersonL. Safir Michalec RN Date initiated:: 02/17/15   Consult Status Consult Status: Follow-up Date: 02/17/15 (pm) Follow-up type: In-patient    Charyl DancerCARVER, Litisha Guagliardo G 02/17/2015, 4:28 AM

## 2015-02-17 NOTE — Lactation Note (Signed)
This note was copied from the chart of Robin [name]. Lactation Consultation Note  Assisted mom with latching baby in a side-lying position.  Jessalyn latched easily and ate well. Mom more confident about BF. Follow-up tomorrow. Patient Name: Robin [name] ZOXWR'UToday's Date: 02/17/2015 Reason for consult: Follow-up assessment   Maternal Data Has patient been taught Hand Expression?: Yes  Feeding Feeding Type: Breast Fed Length of feed: 0 min  LATCH Score/Interventions Latch: Grasps breast easily, tongue down, lips flanged, rhythmical sucking. Intervention(s): Skin to skin;Teach feeding cues;Waking techniques  Audible Swallowing: A few with stimulation Intervention(s): Skin to skin;Hand expression  Type of Nipple: Everted at rest and after stimulation  Comfort (Breast/Nipple): Soft / non-tender     Hold (Positioning): Assistance needed to correctly position infant at breast and maintain latch. Intervention(s): Breastfeeding basics reviewed;Support Pillows;Position options  LATCH Score: 8  Lactation Tools Discussed/Used     Consult Status Consult Status: Follow-up Date: 02/18/15 Follow-up type: In-patient    Soyla DryerJoseph, Gleason Ardoin 02/17/2015, 5:10 PM

## 2015-02-17 NOTE — Progress Notes (Signed)
Ur chart review completed.  

## 2015-02-17 NOTE — Progress Notes (Signed)
Clinical Social Work Department PSYCHOSOCIAL ASSESSMENT - MATERNAL/CHILD 2015/09/03  Patient:  Robin [name], Robin [name]  Account Number:  [id number]  Admit Date:  12/23/15  Robin [name] Name:   Robin [name]   Clinical Social Worker:  Robin [name], CLINICAL SOCIAL WORKER   Date/Time:  2015-08-28 10:00 AM  Date Referred:  11-10-2015      Referred reason  Other - See comment   Other referral source:   MOB is a XXX/security patient as she does not want the Robin [name] or his family to visit while at the hospital.    I:  FAMILY / HOME ENVIRONMENT Child's legal guardian:  PARENT  Guardian - Name Guardian - Age Robin [name] [address] [place], [place] [address]   Other household support members/support persons Name Relationship DOB  Robin [name] 31 years old   GRAND MOTHER    Other support:   MOB reported that she lives with her Robin [name] and 22 years old daugther. She shared that her mother also lives within 10 minutes, and discussed a strong family support system.    II  PSYCHOSOCIAL DATA Information Source:  Family Interview  Occupational hygienist Employment:   Unknown employment status   Financial resources:  [employer] If Medicaid - County:  [place] / Grade:  N/A Music therapist / Industrial/product designer / Early Interventions:   None reported  Cultural issues impacting care:   None reported    III  STRENGTHS Strengths  Adequate Resources  Home prepared for Child (including basic supplies)  Supportive family/friends   Strength comment:    IV  RISK FACTORS AND CURRENT PROBLEMS Current Problem:  YES   Risk Factor & Current Problem Patient Issue Family Issue Risk Factor / Current Problem Comment  Mental Illness Y N MOB presents with history of depression and anxiety since adolescence.  She denied current mental health treatment, but expressed interest in a referral.  Other - See  comment Y N MOB is currently reporting stress secondary to custody issues with her oldest daugther.  Other - See comment Y N MOB reported stress since the Robin [name] is not actively involved or supportive. MOB denied presence of physical safety concerns.    V  SOCIAL WORK ASSESSMENT CSW met with the MOB due to MOB being a "XXX patient" since she does not want the Robin [name] or his family to be involved/present at the hospital.  MOB had numerous visitors and family members present, but she provided consent for them to be present during CSW assessment.  MOB presented as easily engaged and receptive to the visit. She displayed a full range in affect, was in a pleasant mood, no acute mental health concerns noted.  She presents with insight and motivation to address her mental health symptoms, and identified her depression and anxiety as an area in her life in which she would like to focus/improve.    MOB smiled as she reflected upon the arrival of the infant, and discussed that she is looking forward to her 22 year old meeting her for the first time.  MOB expressed sadness secondary to her separation from her 22 year old, but shared belief that her 22 year old is in good care while she is at the hospital (MOB's Robin [name] is caring for her).  MOB also reflected upon a positive labor and birthing experience, and denied any current concerns related to her mental health.  She shared belief  that she has a strong and positive support system which she believes will be beneficial/helpful once she is discharged home and adjusts to having two young children.    MOB acknowledged numerous stressors that may impact her transition into the postpartum period. She shared that she has ongoing stressors related to the Robin [name] since he has been minimally supportive of the pregnancy.  She stated that he and his family can create "drama", which is why she has chosen to establish boundaries with him and not allow him (or his family) at the hospital  nor to sign the birth certificate. She endorsed stressors since he has not appeared motivated to monetarily provide for the infant, but stated that she intends to seek out child support in the postpartum period.  The MOB shared that she has been overwhelmed during the pregnancy because of his level of involvement, since she does not want to be concerned about having sufficient supplies for the infant. She shared that her family has been crucial in helping her prepare for the infant and have emotionally supportive.  MOB denied any safety concerns once she is discharged from the hospital, from either the Robin [name] or his family.   MOB also reported ongoing stress secondary to the father of her 37 year old son. She reported that he has historically been uninvolved, but was demanding custody.  She shared that he and the father have split custody, but continues to be minimally emotionally or financially supportive to their [name].  Per MOB, she intends to take him back to court in order to gain full custody.    MOB acknowledged the negative impact that these stressors have had on her mental health. She endorsed history of anxiety and depression since adolescence, but denied any therapy or medications in "years".  MOB recognized that she can be anxious and "paranoid", and stated that with her 22 year old, she had a hard time sleeping for 9 months out of fear of SIDS, and shared that she would call the pediatrician "all the time" anytime she had concerns about her health.  MOB denied acute symptoms during the pregnancy, but shared an awareness that she is missing out on positive opportunities to interact with her children because the stressors are "draining". She expressed ideal outcome of her symptoms reducing so she can focus her energy on what matters most to her (her children).  MOB denied history of postpartum depression, but acknowledged increased risk due to current stressors.  She shared belief that she can talk to  her family about her feelings, and denied belief that she would ever attempt to harm herself because of the impact it would have on her children.  She expressed interest in referral to restart therapy and medications since she perceives there to be positive benefits of engaging in mental health treatment.   MOB denied additional questions, concerns, or needs at this time. She acknowledged ongoing CSW availability, and expressed appreciation for the visit.  MOB agreeable to contacting CSW as needs arise.  VI SOCIAL WORK PLAN Social Work Secretary/administrator Education  Information/Referral to [employer]  No Further Intervention Required / No Barriers to Discharge   Type of pt/family education:   Postpartum depression   If child protective services report - county:  N/A If child protective services report - date:  N/A Information/referral to community resources comment:   CSW provided mental health referrals to [facility] for therapy and medication management to address symptoms of depression and anxiety.  Other social work plan:   CSW to follow up as needed or upon family request.

## 2015-02-17 NOTE — Progress Notes (Signed)
PPD #1 Sore, cramping Afeb, VSS Fundus firm, NT at U-1 Continue routine postpartum care

## 2015-02-18 MED ORDER — OXYCODONE-ACETAMINOPHEN 5-325 MG PO TABS: 1.0000 | ORAL_TABLET | ORAL | Status: DC | PRN

## 2015-02-18 MED ORDER — IBUPROFEN 600 MG PO TABS: 600.0000 mg | ORAL_TABLET | Freq: Four times a day (QID) | ORAL | Status: DC

## 2015-02-18 NOTE — Lactation Note (Signed)
This note was copied from the chart of Robin [name]. Lactation Consultation Note: DEBP was sat up by LC. #24 flanges are a good fit. Mother pumped for 15 mins. She obtained 1-2 ml of colostrum. Reviewed spoon feeding or use of syringe for small amts of colostrum. Mother was given AAP supplemental guidelines and advised to feed infant with EBM/Formula until milk comes to volume. Advised mother to feed infant every 2-3 hours . Recommend attempt to breastfeed with nipple shield, then supplement infant with EBM/formula. Reviewed milk storage guidelines. Mother and GMOB receptive to teaching and feeding plan.   Patient Name: Robin [name] ZOXWR'UToday's Date: 02/18/2015 Reason for consult: Follow-up assessment   Maternal Data    Feeding Feeding Type: Formula Length of feed: 15 min (on and off with poor latch)  LATCH Score/Interventions Latch: Repeated attempts needed to sustain latch, nipple held in mouth throughout feeding, stimulation needed to elicit sucking reflex. Intervention(s): Waking techniques Intervention(s): Adjust position;Assist with latch  Audible Swallowing: A few with stimulation Intervention(s): Hand expression;Alternate breast massage  Type of Nipple: Everted at rest and after stimulation  Comfort (Breast/Nipple): Soft / non-tender     Hold (Positioning): Assistance needed to correctly position infant at breast and maintain latch. Intervention(s): Support Pillows;Position options  LATCH Score: 7  Lactation Tools Discussed/Used Tools: Nipple Shields Nipple shield size: 20 Breast pump type: Double-Electric Breast Pump WIC Program: Yes Pump Review: Setup, frequency, and cleaning;Milk Storage Initiated by:: Jasmeen Fritsch RN,IBCLC Date initiated:: 02/18/15   Consult Status Consult Status: Follow-up Date: 02/18/15 Follow-up type: In-patient    Stevan BornKendrick, Jaxston Chohan [facility] 02/18/2015, 2:55 PM

## 2015-02-18 NOTE — Anesthesia Postprocedure Evaluation (Signed)
  Anesthesia Post-op Note  Patient: Robin [name] XXXMedford  Procedure(s) Performed: * No procedures listed *  Patient Location: Mother/Baby  Anesthesia Type:Epidural  Level of Consciousness: awake, alert , oriented and patient cooperative  Airway and Oxygen Therapy: Patient Spontanous Breathing  Post-op Pain: mild  Post-op Assessment: Patient's Cardiovascular Status Stable, Respiratory Function Stable, No headache, No backache, No residual numbness and No residual motor weakness  Post-op Vital Signs: stable  Last Vitals:  Filed Vitals:   02/18/15 0618  BP: 109/57  Pulse: 97  Temp: 36.6 C  Resp: 18    Complications: No apparent anesthesia complications

## 2015-02-18 NOTE — Lactation Note (Signed)
This note was copied from the chart of Robin [name] XXXMedford. Lactation Consultation Note: Mother states that she is unable to get infant latched. Mother states that her infant has not had but one good feeding last night. Mother states that other feedings were attempts that infant was mostly sleeping as well as she during STS time. Infant is now 3940 hours old. Assessed mothers nipple's and breast tissue. Mothers  nipples are semi flat. Attempt to latch infant multiple times with and without the nipple shield. Infant was unable to sustain latch. Applied nipple shield and infant was given total of 10 ml with a curved tip syringe. Infant spit large amt of curdled formula and clear fld. Informed Dr Maisie Fushomas of poor feeding and spitting. Infants discharge to be canceled per Dr Maisie Fushomas.   Mother has WIC. [facility] on March 3. Staff nurse to sat up DEBP with instructions. Mother to page for assistance as needed.   Patient Name: Robin [name] XXXMedford YQMVH'QToday's Date: 02/18/2015 Reason for consult: Follow-up assessment   Maternal Data    Feeding Feeding Type: Formula Length of feed: 15 min (on and off with poor latch)  LATCH Score/Interventions Latch: Repeated attempts needed to sustain latch, nipple held in mouth throughout feeding, stimulation needed to elicit sucking reflex. Intervention(s): Waking techniques Intervention(s): Adjust position;Assist with latch  Audible Swallowing: A few with stimulation Intervention(s): Hand expression;Alternate breast massage  Type of Nipple: Everted at rest and after stimulation  Comfort (Breast/Nipple): Soft / non-tender     Hold (Positioning): Assistance needed to correctly position infant at breast and maintain latch. Intervention(s): Support Pillows;Position options  LATCH Score: 7  Lactation Tools Discussed/Used Tools: Nipple Shields Nipple shield size: 20 Breast pump type: Double-Electric Breast Pump WIC Program:  Yes Pump Review: Setup, frequency, and cleaning;Milk Storage Initiated by:: Skyanne Welle RN,IBCLC Date initiated:: 02/18/15   Consult Status Consult Status: Follow-up Date: 02/18/15 Follow-up type: In-patient    Stevan BornKendrick, Carleigh Buccieri [facility] 02/18/2015, 2:44 PM

## 2015-02-18 NOTE — Discharge Summary (Signed)
Obstetric Discharge Summary Reason for Admission: onset of labor Prenatal Procedures: none Intrapartum Procedures: spontaneous vaginal delivery Postpartum Procedures: none Complications-Operative and Postpartum: 2nd degree perineal laceration HEMOGLOBIN  Date Value Ref Range Status  02/16/2015 10.7* 12.0 - 15.0 g/dL Final   HCT  Date Value Ref Range Status  02/16/2015 33.0* 36.0 - 46.0 % Final    Physical Exam:  General: alert Lochia: appropriate Uterine Fundus: firm   Discharge Diagnoses: Term Pregnancy-delivered  Discharge Information: Date: 02/18/2015 Activity: pelvic rest Diet: routine Medications: Ibuprofen Condition: stable Instructions: refer to practice specific booklet Discharge to: home Follow-up Information    Follow up with Eligio Angert D, MD. Schedule an appointment as soon as possible for a visit in 6 weeks.   Specialty:  Obstetrics and Gynecology   Contact information:   [address], [address] [place] [address] [phone]       Newborn Data: Live born female  Birth Weight: 6 lb 0.7 oz (2741 g) APGAR: 9, 9  Home with mother.  Latoia Eyster D 02/18/2015, 8:21 AM

## 2015-02-18 NOTE — Discharge Instructions (Signed)
As per discharge pamphlet °

## 2015-02-18 NOTE — Progress Notes (Signed)
CSW followed up with the MOB in order to continue to provide emotional support as she prepares to transition home.   The MOB reported that she was excited to return home, but shared that she was feeling tired since the infant slept minimally overnight.  The patient continues to report feeling "good", and reported minimal anxiety.  She shared that her mother intends to stay with her tonight in order to provide her with additional support.  The MOB continues to discuss her previous frustrations with the FOB and the father of her first daughter. CSW assisted the MOB to process her feelings, and the MOB expressed a belief that it has been beneficial for her to be able to talk to someone while at the hospital who is outside of her family.  She continued to express feeling hopeful that she will have a smooth transition home, and all custody issues will be resolved in the near future.  CSW returned to topic of daily self-care and mental health care given the MOB's stress level.  The MOB shared that she continues to be interested in referrals.  CSW reviewed resources and provided referral information to the patient.  The patient expressed interest and shared that she intends to schedule an intake appointment.  She verbalized an awareness of need to care for herself in order to be genuinely present with her children.   The MOB has CSW contact information in the event that she has questions or concerns after hospital discharge.   There continue to be no barriers to discharge.  Loleta BooksSarah Symphani Eckstrom, LCSW [phone]

## 2015-02-18 NOTE — Progress Notes (Signed)
PPD #2 Doing well Afeb, VSS Fundus firm D/c home 

## 2015-02-19 ENCOUNTER — Inpatient Hospital Stay (HOSPITAL_COMMUNITY): Admission: RE | Admit: 2015-02-19 | Payer: Medicaid Other | Source: Ambulatory Visit

## 2015-04-02 ENCOUNTER — Emergency Department
Admit: 2015-04-02 | Disposition: A | Discharge status: Home / Self Care | Payer: Self-pay | Admitting: Emergency Medicine

## 2015-04-16 ENCOUNTER — Emergency Department
Admit: 2015-04-16 | Disposition: A | Discharge status: Home / Self Care | Payer: Self-pay | Admitting: Emergency Medicine

## 2015-06-16 ENCOUNTER — Ambulatory Visit: Payer: Medicaid Other | Admitting: Adult Health

## 2015-06-17 ENCOUNTER — Encounter: Payer: Self-pay | Admitting: Adult Health

## 2015-11-11 IMAGING — CR DG SHOULDER 3+V*R*
1 series · 3 of 3 positions shown · non-contrast
Comparison: None.

CLINICAL DATA: 23-year-old female with right shoulder pain for
months, now all involving her neck and radiating down the right all
arm. Initial encounter.

EXAM:
DG SHOULDER 3+ VIEWS RIGHT

[Series 1: dxr shoulder right complete · 0.14mm/px · 3 of 3 slices shown]
[im 1/3]
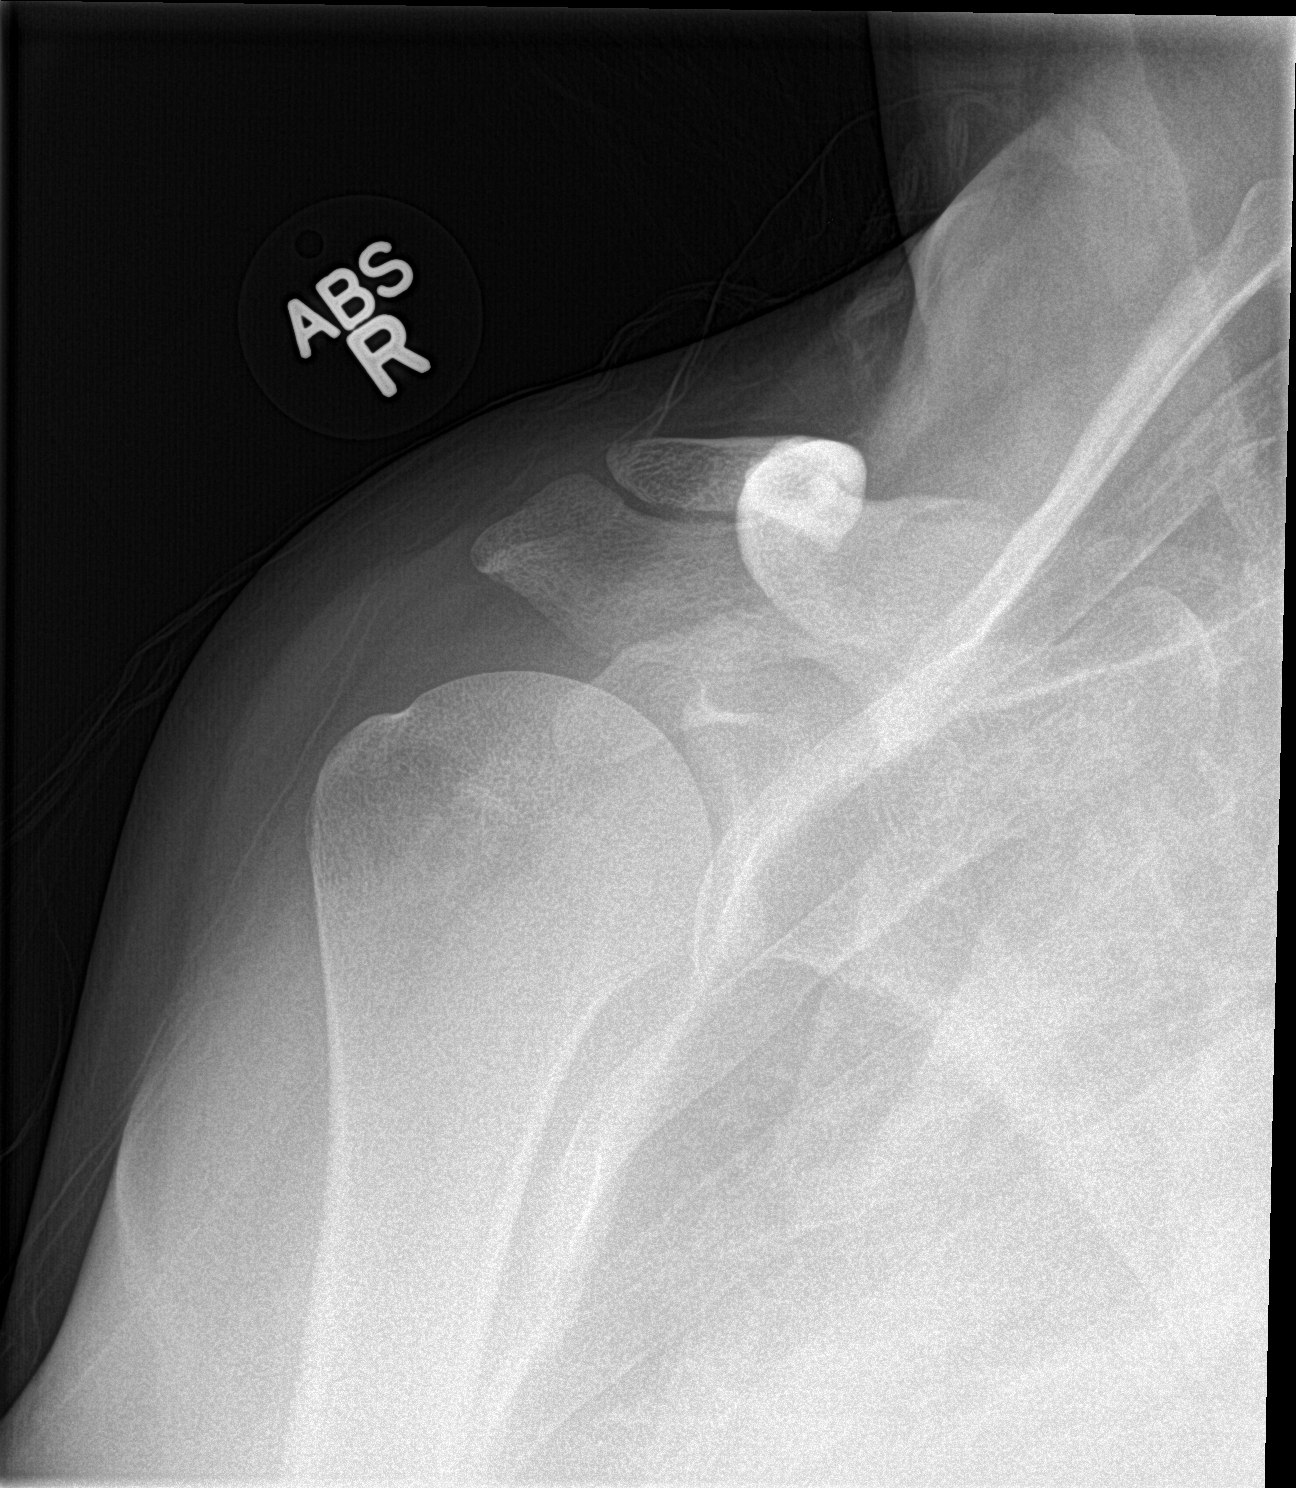
[im 2/3]
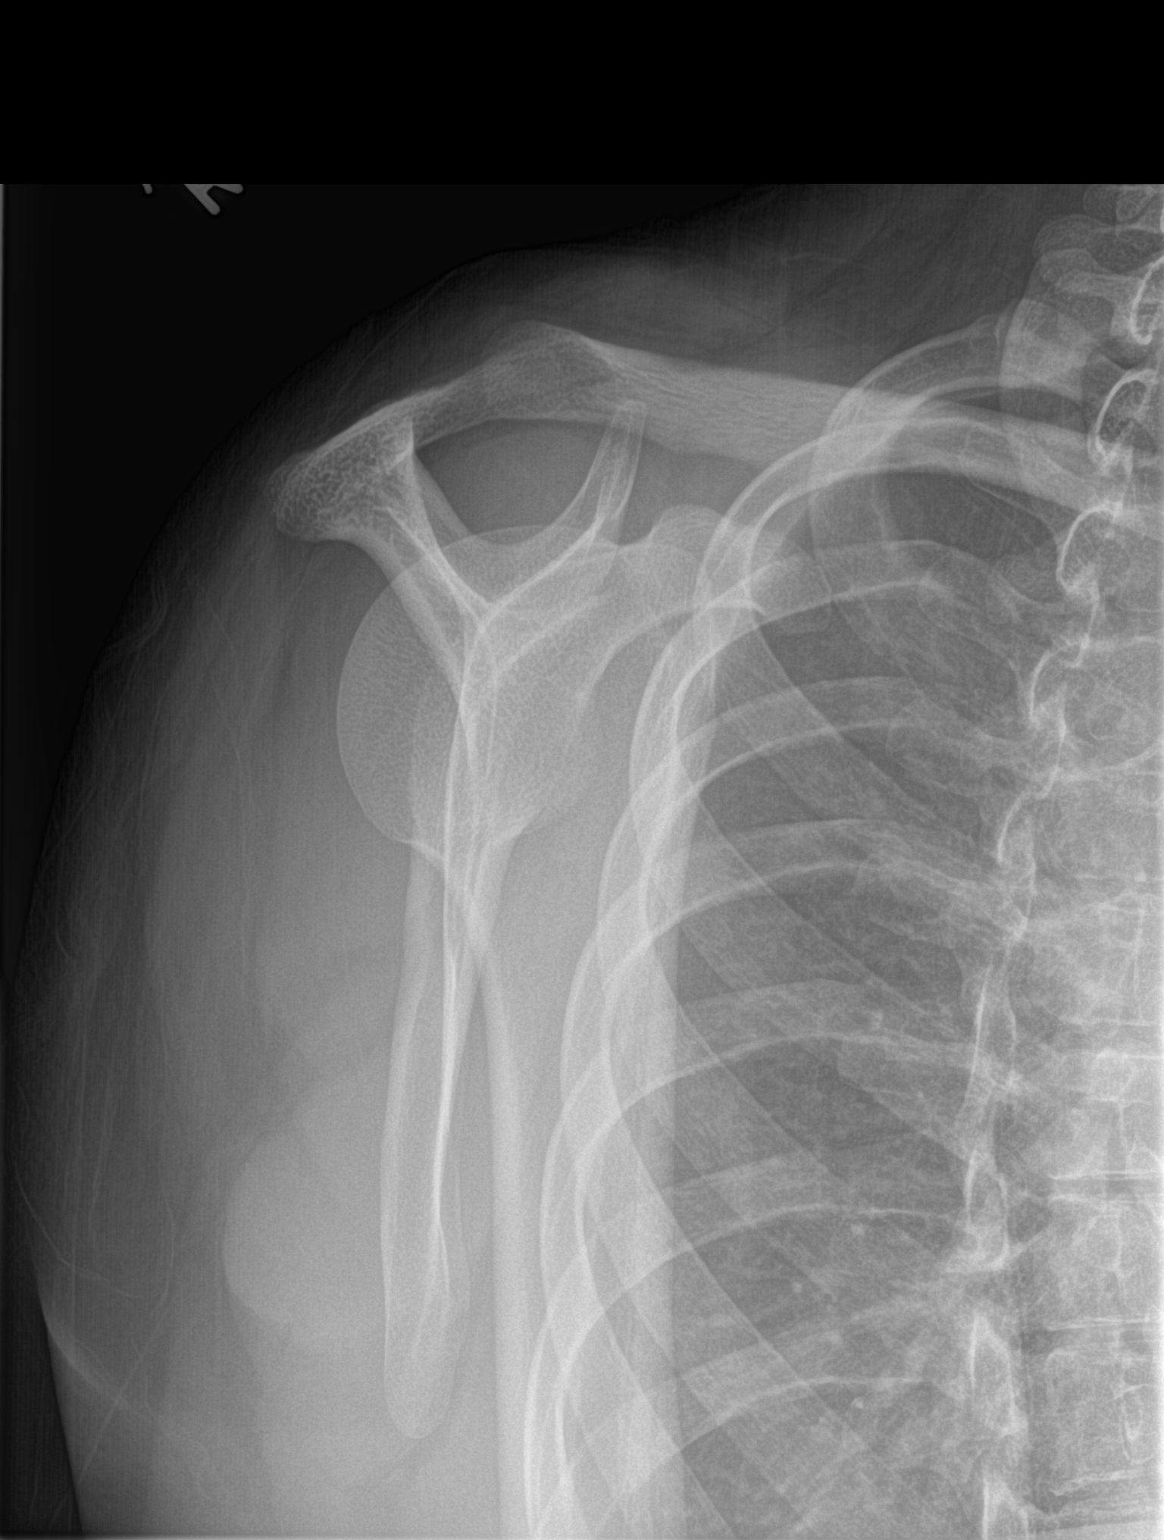
[im 3/3]
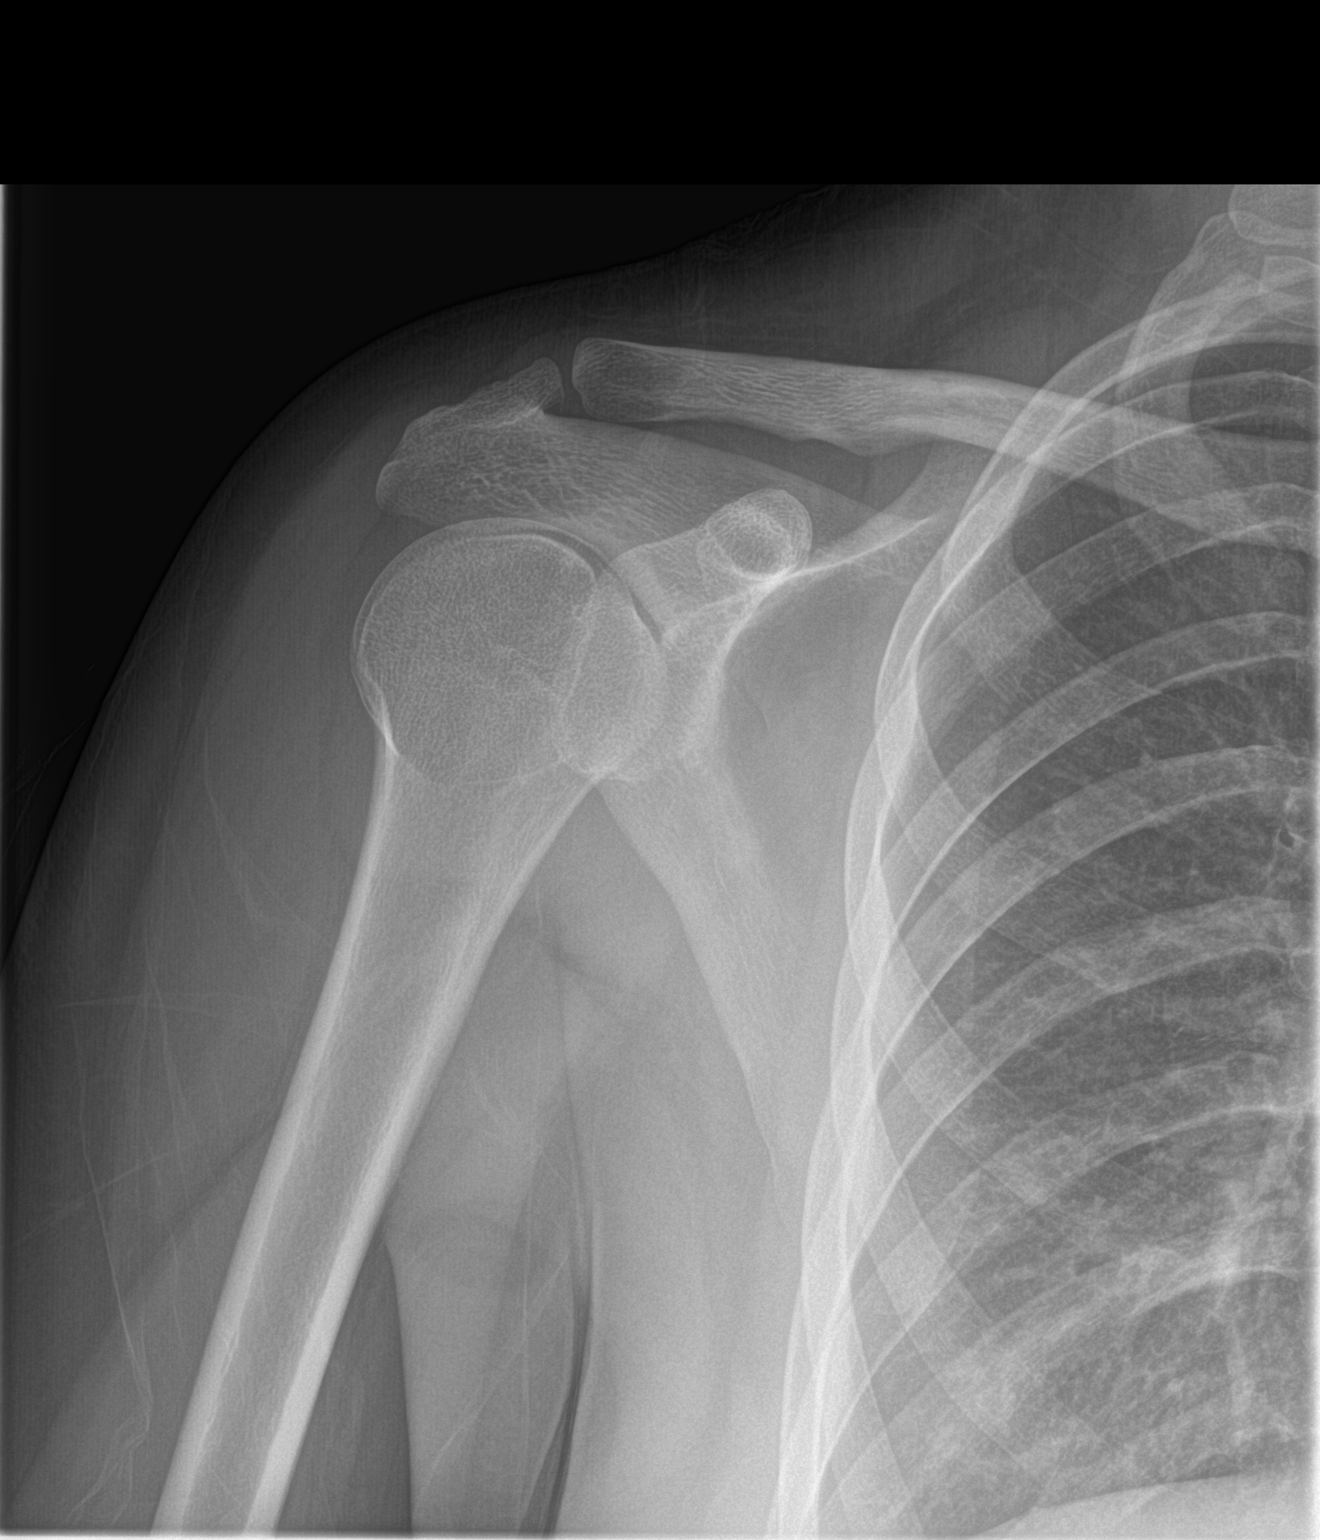

[3 of 3 positions shown; findings below may reference images not displayed]

FINDINGS: No glenohumeral joint dislocation. Bone mineralization is within
normal limits. Proximal right humerus intact. Visible right clavicle
and scapula appear normal. Visible right ribs and lung parenchyma
are within normal limits.
IMPRESSION: No osseous abnormality identified at the right shoulder.

## 2016-01-02 ENCOUNTER — Emergency Department
Admission: EM | Admit: 2016-01-02 | Discharge: 2016-01-05 | Disposition: A | Discharge status: Other Acute Inpatient Hospital | Payer: Medicaid Other | Attending: Emergency Medicine | Admitting: Emergency Medicine

## 2016-01-02 ENCOUNTER — Encounter: Payer: Self-pay | Admitting: Emergency Medicine

## 2016-01-02 DIAGNOSIS — F333 Major depressive disorder, recurrent, severe with psychotic symptoms: Secondary | ICD-10-CM | POA: Insufficient documentation

## 2016-01-02 DIAGNOSIS — F17210 Nicotine dependence, cigarettes, uncomplicated: Secondary | ICD-10-CM | POA: Diagnosis not present

## 2016-01-02 DIAGNOSIS — Z880 Allergy status to penicillin: Secondary | ICD-10-CM | POA: Diagnosis not present

## 2016-01-02 DIAGNOSIS — Z3202 Encounter for pregnancy test, result negative: Secondary | ICD-10-CM | POA: Diagnosis not present

## 2016-01-02 DIAGNOSIS — F411 Generalized anxiety disorder: Secondary | ICD-10-CM | POA: Diagnosis not present

## 2016-01-02 DIAGNOSIS — F3132 Bipolar disorder, current episode depressed, moderate: Secondary | ICD-10-CM | POA: Diagnosis not present

## 2016-01-02 LAB — CBC WITH DIFFERENTIAL/PLATELET
BASOS PCT: 0 %
Basophils Absolute: 0 10*3/uL (ref 0–0.1)
EOS ABS: 0.1 10*3/uL (ref 0–0.7)
Eosinophils Relative: 1 %
HCT: 36.6 % (ref 35.0–47.0)
HEMOGLOBIN: 12.1 g/dL (ref 12.0–16.0)
Lymphocytes Relative: 26 %
Lymphs Abs: 2.4 10*3/uL (ref 1.0–3.6)
MCH: 27 pg (ref 26.0–34.0)
MCHC: 33 g/dL (ref 32.0–36.0)
MCV: 81.9 fL (ref 80.0–100.0)
MONO ABS: 0.4 10*3/uL (ref 0.2–0.9)
MONOS PCT: 4 %
NEUTROS PCT: 69 %
Neutro Abs: 6.4 10*3/uL (ref 1.4–6.5)
Platelets: 350 10*3/uL (ref 150–440)
RBC: 4.47 MIL/uL (ref 3.80–5.20)
RDW: 16.1 % — ABNORMAL HIGH (ref 11.5–14.5)
WBC: 9.3 10*3/uL (ref 3.6–11.0)

## 2016-01-02 LAB — COMPREHENSIVE METABOLIC PANEL
ALBUMIN: 4.3 g/dL (ref 3.5–5.0)
ALT: 18 U/L (ref 14–54)
ANION GAP: 7 (ref 5–15)
AST: 19 U/L (ref 15–41)
Alkaline Phosphatase: 93 U/L (ref 38–126)
BILIRUBIN TOTAL: 0.5 mg/dL (ref 0.3–1.2)
BUN: 14 mg/dL (ref 6–20)
CALCIUM: 9.1 mg/dL (ref 8.9–10.3)
CO2: 25 mmol/L (ref 22–32)
CREATININE: 0.79 mg/dL (ref 0.44–1.00)
Chloride: 105 mmol/L (ref 101–111)
GFR calc Af Amer: >60 mL/min (ref >60)
GFR calc non Af Amer: >60 mL/min (ref >60)
Glucose, Bld: 109 mg/dL — ABNORMAL HIGH (ref 65–99)
Potassium: 3.7 mmol/L (ref 3.5–5.1)
SODIUM: 137 mmol/L (ref 135–145)
TOTAL PROTEIN: 7.2 g/dL (ref 6.5–8.1)

## 2016-01-02 LAB — URINE DRUG SCREEN, QUALITATIVE (ARMC ONLY)
AMPHETAMINES, UR SCREEN: NOT DETECTED
Barbiturates, Ur Screen: NOT DETECTED
Benzodiazepine, Ur Scrn: NOT DETECTED
Cannabinoid 50 Ng, Ur ~~LOC~~: NOT DETECTED
Cocaine Metabolite,Ur ~~LOC~~: NOT DETECTED
MDMA (Ecstasy)Ur Screen: NOT DETECTED
METHADONE SCREEN, URINE: NOT DETECTED
OPIATE, UR SCREEN: NOT DETECTED
Phencyclidine (PCP) Ur S: NOT DETECTED
Tricyclic, Ur Screen: NOT DETECTED

## 2016-01-02 LAB — SALICYLATE LEVEL: Salicylate Lvl: <4 mg/dL (ref 2.8–30.0)

## 2016-01-02 LAB — ETHANOL: Alcohol, Ethyl (B): <5 mg/dL (ref <5)

## 2016-01-02 LAB — ACETAMINOPHEN LEVEL

## 2016-01-02 LAB — POCT PREGNANCY, URINE: Preg Test, Ur: NEGATIVE

## 2016-01-02 MED ORDER — LORAZEPAM 0.5 MG PO TABS
0.5000 mg | ORAL_TABLET | Freq: Once | ORAL | Status: AC
  Administered 2016-01-02: 0.5 mg via ORAL
  Filled 2016-01-02: qty 1

## 2016-01-02 MED ORDER — ACETAMINOPHEN 500 MG PO TABS
1000.0000 mg | ORAL_TABLET | ORAL | Status: AC
  Administered 2016-01-02: 1000 mg via ORAL
  Filled 2016-01-02: qty 2

## 2016-01-02 NOTE — ED Notes (Signed)
Hand off report given to Matt, RN.

## 2016-01-02 NOTE — ED Provider Notes (Signed)
[facility] Emergency Department Provider Note REMINDER - THIS NOTE IS NOT A FINAL MEDICAL RECORD UNTIL IT IS SIGNED. UNTIL THEN, THE CONTENT BELOW MAY REFLECT INFORMATION FROM A DOCUMENTATION TEMPLATE, NOT THE ACTUAL PATIENT VISIT. ____________________________________________  Time seen: Approximately 10:54 PM  I have reviewed the triage vital signs and the nursing notes.   HISTORY  Chief Complaint Depression    HPI Robin [name] is a 25 y.o. female presents for evaluation of severe depression, also passive thoughts of wanting to harm herself or possibly commit suicide.  Denies pain, recent infection, pregnancy. She denies any attempt at overdose or injury but is concerned that she may be at risk of harming herself and she needs to be available to take care of her children and feels that her symptoms are getting to the point that she is having difficulty with this and severely depressed.  She does report a previous suicide attempt and psychiatric consultation as a child.  Past Medical History  Diagnosis Date  . Pregnancy as incidental finding   . No pertinent past medical history   . ADHD (attention deficit hyperactivity disorder)   . Chronic bronchitis   . Eczema   . Headache(784.0)     miagraine  . Ovarian cyst   . Shortness of breath   . Normal pregnancy, first 02/04/2012  . SVD (spontaneous vaginal delivery) 02/05/2012  . Chronic bronchitis (HCC)     2x a year. last episode 2.5 years ago    Patient Active Problem List   Diagnosis Date Noted  . Active labor at term 02/16/2015  . Migraine, unspecified, without mention of intractable migraine without mention of status migrainosus 08/15/2014  . 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 Abdominal pregnancy with intrauterine pregnancy 08/15/2014  . Chronic daily headache 08/15/2014  . SVD (spontaneous vaginal delivery) 02/05/2012    Past Surgical History  Procedure Laterality Date  . Tonsillectomy     . Wisdom tooth extraction    . Tonsillectomy      Current Outpatient Rx  Name  Route  Sig  Dispense  Refill  . ibuprofen (ADVIL,MOTRIN) 800 MG tablet   Oral   Take 800 mg by mouth every 8 (eight) hours as needed (for migraines).           Allergies Penicillins  Family History  Problem Relation Age of Onset  . Diabetes Mother   . Hyperlipidemia Mother   . Chronic bronchitis Mother   . Endometriosis Mother   . Migraines Mother   . Miscarriages / [place] Mother   . Cancer Maternal Aunt     renal  . Cancer Maternal Uncle     breast  . Mental illness Maternal Grandmother     bipolar, paranoid schizophrenia  . COPD Maternal Grandmother   . Depression Maternal Grandmother   . Heart disease Maternal Grandmother   . Hypertension Maternal Grandmother   . Hyperthyroidism Maternal Grandmother   . Cancer Maternal Grandmother     liver  . Migraines Maternal Grandmother   . Miscarriages / Stillbirths Maternal Grandmother   . Heart disease Paternal Grandmother   . Heart disease Paternal Grandfather   . Heart attack Paternal Grandfather   . Stroke Paternal Grandfather   . Heart disease Father   . Obesity Father   . Hypertension Father     Social History Social History  Substance Use Topics  . Smoking status: Current Every Day Smoker -- 0.50 packs/day for 1 years    Types: Cigarettes  Last Attempt to Quit: 07/01/2011  . Smokeless tobacco: Never Used  . Alcohol Use: No    Review of Systems Constitutional: No fever/chills Eyes: No visual changes. ENT: No sore throat. Cardiovascular: Denies chest pain. Respiratory: Denies shortness of breath. Gastrointestinal: No abdominal pain.  No nausea, no vomiting.  No diarrhea.  No constipation. Genitourinary: Negative for dysuria. Musculoskeletal: Negative for back pain. Skin: Negative for rash. Neurological: Negative for headaches, focal weakness or numbness.  10-point ROS otherwise  negative.  ____________________________________________   PHYSICAL EXAM:  VITAL SIGNS: ED Triage Vitals  Enc Vitals Group     BP 01/02/16 2027 135/90 mmHg     Pulse Rate 01/02/16 2027 81     Resp 01/02/16 2027 20     Temp 01/02/16 2027 98.1 F (36.7 C)     Temp Source 01/02/16 2027 Oral     SpO2 01/02/16 2027 99 %     Weight 01/02/16 2027 160 lb (72.576 kg)     Height 01/02/16 2027 5\' 3"  (1.6 m)     Head Cir --      Peak Flow --      Pain Score 01/02/16 2027 8     Pain Loc --      Pain Edu? --      Excl. in GC? --    Constitutional: Alert and oriented. Well appearing and in no acute distress. Amicable and pleasant. Eyes: Conjunctivae are normal. PERRL. EOMI. Head: Atraumatic. Nose: No congestion/rhinnorhea. Mouth/Throat: Mucous membranes are moist.  Oropharynx non-erythematous. Neck: No stridor.   Cardiovascular: Normal rate, regular rhythm. Grossly normal heart sounds.  Good peripheral circulation. Respiratory: Normal respiratory effort.  No retractions. Lungs CTAB. Gastrointestinal: Soft and nontender. No distention. No abdominal bruits. No CVA tenderness. Musculoskeletal: No lower extremity tenderness nor edema.  No joint effusions. Neurologic:  Normal speech and language. No gross focal neurologic deficits are appreciated. No gait instability. Skin:  Skin is warm, dry and intact. No rash noted. Psychiatric: Mood and affect are somewhat flat and slightly depressed. Denies overt hallucinations. Does have some passive thoughts of wanting to harm herself. She feels suicidal at times.  ____________________________________________   LABS (all labs ordered are listed, but only abnormal results are displayed)  Labs Reviewed  COMPREHENSIVE METABOLIC PANEL - Abnormal; Notable for the following:    Glucose, Bld 109 (*)    All other components within normal limits  CBC WITH DIFFERENTIAL/PLATELET - Abnormal; Notable for the following:    RDW 16.1 (*)    All other components  within normal limits  ACETAMINOPHEN LEVEL - Abnormal; Notable for the following:    Acetaminophen (Tylenol), Serum <10 (*)    All other components within normal limits  ETHANOL  SALICYLATE LEVEL  URINE DRUG SCREEN, QUALITATIVE ([facility] ONLY)  POCT PREGNANCY, URINE  POC URINE PREG, ED   ____________________________________________  EKG   ____________________________________________  RADIOLOGY   ____________________________________________   PROCEDURES  Procedure(s) performed: None  Critical Care performed: No  ____________________________________________   INITIAL IMPRESSION / ASSESSMENT AND PLAN / ED COURSE  Pertinent labs & imaging results that were available during my care of the patient were reviewed by me and considered in my medical decision making (see chart for details).  Patient presents on involuntary commitment for concerns of major depressive symptoms with passive suicidal thoughts. She denies and medical screening labs indicate no acute emergent medical condition. Patient was discussed with psychiatrist who recommends likely inpatient care. She will remain on involuntary commitment. Discussed with the  patient and she is agreeable with inpatient psychiatric hospitalization. Dr. Alvester MorinBell of psychiatry recommending inpatient placement at this time. ____________________________________________   FINAL CLINICAL IMPRESSION(S) / ED DIAGNOSES  Final diagnoses:  Major depressive disorder, recurrent, severe with psychotic features (HCC)      Sharyn CreamerMark Cardelia Sassano, MD 01/02/16 2324

## 2016-01-02 NOTE — ED Notes (Signed)
Pt reports wanting to hurt self 2 hours before coming to ED, pt reports no plan and denies SI/HI/AH/VH ATT.  Pt  c/o of depression, anxiety, anger and not sleeping.  Pt reports having HA.  Pt denies medications.  Pt requests to see a doctor "I shouldn't feel like this and I have kids at home", pt with grandmother in triage.

## 2016-01-02 NOTE — [facility] Counselor (Signed)
Writer made pt aware if Psych MD Dr.Bell's recommendation for inpatient admission. Pt is agrees with recommendation. Pt requested mediation to assist with sleeping. Writer informed Pt RN [place](Luis)

## 2016-01-02 NOTE — ED Notes (Signed)
Meal tray given to patient by this RN.
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
## 2016-01-02 NOTE — [facility] Counselor (Addendum)
Writer ran pt by Psych MD on call Dr. Alvester MorinBell and pt does meet criteria for inpatient admission. Writer will began referral process. EDP Dr.Quale is aware.

## 2016-01-03 MED ORDER — TRAZODONE HCL 50 MG PO TABS
150.0000 mg | ORAL_TABLET | Freq: Every day | ORAL | Status: DC
  Administered 2016-01-03 – 2016-01-04 (×2): 150 mg via ORAL
  Filled 2016-01-03 (×2): qty 1

## 2016-01-03 MED ORDER — NICOTINE POLACRILEX 2 MG MT GUM
2.0000 mg | CHEWING_GUM | OROMUCOSAL | Status: DC
  Administered 2016-01-03: 2 mg via ORAL

## 2016-01-03 MED ORDER — NICOTINE POLACRILEX 2 MG MT GUM
CHEWING_GUM | OROMUCOSAL | Status: AC
  Filled 2016-01-03: qty 1

## 2016-01-03 NOTE — [facility] Counselor (Signed)
[facility] and resubmitted pt info per request.

## 2016-01-03 NOTE — ED Notes (Signed)
Patient alert and oriented. She states she still feels "depressed and angry" although denies SI. Patient states she needs medication both for her depression and anxiety. She would not talk about what might be triggering these feelings.  Patient states she does not want to go home, she wants to be hospitalized. She has two children who are currently with her family. Maintained on 15 minute checks and observation by security camera for safety.

## 2016-01-03 NOTE — ED Provider Notes (Signed)
-----------------------------------------   6:22 AM on 01/03/2016 -----------------------------------------   Blood pressure 135/90, pulse 81, temperature 98.1 F (36.7 C), temperature source Oral, resp. rate 20, height 5\' 3"  (1.6 m), weight 160 lb (72.576 kg), last menstrual period 12/30/2015, SpO2 99 %, unknown if currently breastfeeding.  The patient had no acute events since last update.  Calm and cooperative at this time.  Disposition is pending per Psychiatry/Behavioral Medicine team recommendations.     Rebecka ApleyAllison P Micheal Sheen, MD 01/03/16 [phone]

## 2016-01-03 NOTE — ED Notes (Signed)
Patient resting quietly in room. No noted distress or abnormal behaviors noted. Will continue 15 minute checks and observation by security camera for safety. 

## 2016-01-03 NOTE — ED Notes (Signed)
Patient to ED-[facility] from ED ambulatory without difficulty to room #2.  Report taken from [facility] RN.  Patient is alert and oriented, calm and cooperative and in no apparent distress currently.  Patient denies pain currently.  Patient is oriented to the unit.  Patient denies suicidal ideation, homicidal ideation, auditory or visual hallucinations currently.  Patient is made aware of security cameras and q.15 minute safety checks.  Patient is encouraged to notify staff with any questions or concerns.

## 2016-01-03 NOTE — ED Notes (Signed)
Report received from Demetrios IsaacsAmy H., RN. Pt. Alert and oriented in no distress; verbalizes having SI;  HI, AVH and pain.  Pt. Instructed to come to me with problems or concerns.Will continue to monitor for safety via security cameras and Q 15 minute checks.

## 2016-01-03 NOTE — BH Assessment (Signed)
Followed up with referrals: [facility] [phone] unable to reach anyone in their admission department [facility] ([phone] ext. [phone]), No Beds. Was advised to check back tomorrow. [facility]-([phone]), No Beds. On [facility] [phone])- Was unable to reach anyone in their admission department

## 2016-01-03 NOTE — ED Notes (Signed)
Pt. Noted in the day room watching the tv. No complaints or concerns voiced. No distress or abnormal behavior noted. Will continue to monitor with security cameras. Q 15 minute rounds continue. 

## 2016-01-03 NOTE — [facility] Counselor (Signed)
Pt referred to:  [facility] [phone] [facility] [phone] [facility] [phone] [facility] [phone]

## 2016-01-03 NOTE — ED Notes (Signed)
Patient has been cooperative with all nursing interventions. She states she wants " Xanax" for anxiety although she has been calm, able to watch television and not exhibiting any other  s/s distress. Will continue to monitor mood. Patient is aware that inpatient hospitalization is being recommended for her. She verbalized understanding of this.  All safety precautions maintained.

## 2016-01-03 NOTE — ED Notes (Signed)
Report on pt. [facility] [place] [facility].

## 2016-01-03 NOTE — ED Notes (Signed)
Patient out in dayroom. Social with select peers. Patient anxious for transfer to inpatient unit, disposition still in process.  Maintained on 15 minute checks and observation by security camera for safety.

## 2016-01-03 NOTE — ED Notes (Signed)
Pt. Noted in  room watching the tv.;. No complaints or concerns voiced. No distress or abnormal behavior noted. Will continue to monitor with security cameras. Q 15 minute rounds continue. 

## 2016-01-03 NOTE — ED Notes (Signed)
Report received from Amy H., RN. Pt. Alert and oriented in no distress; verbalizes having SI; denies having HI, AVH and pain.  Pt. Instructed to come to me with problems or concerns.Will continue to monitor for safety via security cameras and Q 15 minute checks. 

## 2016-01-03 NOTE — ED Notes (Signed)
Patient asleep in room. No noted distress or abnormal behavior. Will continue 15 minute checks and observation by security cameras for safety. 

## 2016-01-03 NOTE — ED Notes (Signed)

## 2016-01-03 NOTE — ED Notes (Signed)
Patient out in dayroom. Mood is dysthymic with congruent affect. Denies SI. Spoke to grandmother on the phone. Patient offers no complaints. Maintained on 15 minute checks and observation by security camera for safety.

## 2016-01-03 NOTE — ED Notes (Signed)
Patient resting quietly in room. No noted distress or abnormal behaviors noted. Will continue 15 minute checks and observation by security camera for safety.  Patient given dinner.

## 2016-01-03 NOTE — ED Notes (Signed)

## 2016-01-03 NOTE — ED Notes (Signed)
Patient reports she does not feel suicidal now. Her affect is bright. She still wants to be hospitalized.  Medications ordered for sleep. Maintained on 15 minute checks and observation by security camera for safety.

## 2016-01-04 DIAGNOSIS — F3132 Bipolar disorder, current episode depressed, moderate: Secondary | ICD-10-CM

## 2016-01-04 DIAGNOSIS — F411 Generalized anxiety disorder: Secondary | ICD-10-CM

## 2016-01-04 LAB — TSH: TSH: 0.997 u[IU]/mL (ref 0.350–4.500)

## 2016-01-04 MED ORDER — HYDROXYZINE HCL 25 MG PO TABS
25.0000 mg | ORAL_TABLET | Freq: Three times a day (TID) | ORAL | Status: DC | PRN
  Administered 2016-01-04 – 2016-01-05 (×3): 25 mg via ORAL
  Filled 2016-01-04 (×3): qty 1

## 2016-01-04 MED ORDER — NICOTINE 21 MG/24HR TD PT24
MEDICATED_PATCH | TRANSDERMAL | Status: AC
  Administered 2016-01-04: 21 mg via TRANSDERMAL
  Filled 2016-01-04: qty 1

## 2016-01-04 MED ORDER — QUETIAPINE FUMARATE 25 MG PO TABS
50.0000 mg | ORAL_TABLET | Freq: Every day | ORAL | Status: DC
  Administered 2016-01-04: 50 mg via ORAL
  Filled 2016-01-04: qty 2

## 2016-01-04 MED ORDER — SERTRALINE HCL 50 MG PO TABS: 50.0000 mg | ORAL_TABLET | Freq: Every day | ORAL | Status: DC

## 2016-01-04 MED ORDER — NICOTINE 21 MG/24HR TD PT24
21.0000 mg | MEDICATED_PATCH | Freq: Every day | TRANSDERMAL | Status: DC
  Administered 2016-01-04 – 2016-01-05 (×3): 21 mg via TRANSDERMAL
  Filled 2016-01-04: qty 1

## 2016-01-04 NOTE — ED Notes (Signed)
BEHAVIORAL HEALTH ROUNDING Patient sleeping: Yes.   Patient alert and oriented: not applicable SLEEPING Behavior appropriate: Yes.  ; If no, describe: SLEEPING Nutrition and fluids offered: No SLEEPING Toileting and hygiene offered: NoSLEEPING Sitter present: not applicable, Q 15 min safety rounds and observation via security camera. Law enforcement present: Yes ODS 

## 2016-01-04 NOTE — ED Notes (Signed)
BEHAVIORAL HEALTH ROUNDING  Patient sleeping: No.  Patient alert and oriented: yes  Behavior appropriate: Yes. ; If no, describe:  Nutrition and fluids offered: Yes  Toileting and hygiene offered: Yes  Sitter present: not applicable, Q 15 min safety rounds and observation via security camera. Law enforcement present: Yes ODS  

## 2016-01-04 NOTE — ED Notes (Signed)
Pt oob and stated that she was feeling anxious and I notified the Dr

## 2016-01-04 NOTE — ED Notes (Signed)
Pt. Noted in room. No complaints or concerns voiced. No distress or abnormal behavior noted. Will continue to monitor with security cameras. Q 15 minute rounds continue.on telephone

## 2016-01-04 NOTE — ED Notes (Signed)
Report received from RuthB. RN. Pt. Alert and oriented in no distress; verbalizes havingSI; denies having HI, AVH and pain.  Pt. Instructed to come to me with problems or concerns.Will continue to monitor for safety via security cameras and Q 15 minute checks.

## 2016-01-04 NOTE — ED Notes (Signed)
Pt offered breakfast , has not eaten she remains asleep in no distress  Has offered no c/o

## 2016-01-04 NOTE — ED Provider Notes (Signed)
-----------------------------------------   5:38 AM on 01/04/2016 -----------------------------------------   Blood pressure 113/74, pulse 88, temperature 97.7 F (36.5 C), temperature source Oral, resp. rate 18, height 5\' 3"  (1.6 m), weight 72.576 kg, last menstrual period 12/30/2015, SpO2 98 %, unknown if currently breastfeeding.  The patient had no acute events since last update.  Calm and cooperative at this time.  Disposition is pending per Psychiatry/Behavioral Medicine team recommendations.     Loleta Roseory Charise Leinbach, MD 01/04/16 [phone]

## 2016-01-04 NOTE — ED Notes (Signed)

## 2016-01-04 NOTE — ED Notes (Signed)
ENVIRONMENTAL ASSESSMENT  Potentially harmful objects out of patient reach: Yes.  Personal belongings secured: Yes.  Patient dressed in hospital provided attire only: Yes.  Plastic bags out of patient reach: Yes.  Patient care equipment (cords, cables, call bells, lines, and drains) shortened, removed, or accounted for: Yes.  Equipment and supplies removed from bottom of stretcher: Yes.  Potentially toxic materials out of patient reach: Yes.  Sharps container removed or out of patient reach: Yes.   BEHAVIORAL HEALTH ROUNDING  Patient sleeping: No.  Patient alert and oriented: yes  Behavior appropriate: Yes. ; If no, describe:  Nutrition and fluids offered: Yes  Toileting and hygiene offered: Yes  Sitter present: not applicable, Q 15 min safety rounds and observation via security camera. Law enforcement present: Yes ODS   ED BHU PLACEMENT JUSTIFICATION  Is the patient under IVC or is there intent for IVC: Yes.  Is the patient medically cleared: Yes.  Is there vacancy in the ED BHU: Yes.  Is the population mix appropriate for patient: Yes.  Is the patient awaiting placement in inpatient or outpatient setting: Yes.  Has the patient had a psychiatric consult: Yes.  Survey of unit performed for contraband, proper placement and condition of furniture, tampering with fixtures in bathroom, shower, and each patient room: Yes. ; Findings: All clear  APPEARANCE/BEHAVIOR  calm, cooperative and adequate rapport can be established  NEURO ASSESSMENT  Orientation: time, place and person  Hallucinations: No.None noted (Hallucinations)  Speech: Normal  Gait: normal  RESPIRATORY ASSESSMENT  WNL  CARDIOVASCULAR ASSESSMENT  WNL  GASTROINTESTINAL ASSESSMENT  WNL  EXTREMITIES  WNL  PLAN OF CARE  Provide calm/safe environment. Vital signs assessed twice daily. ED BHU Assessment once each 12-hour shift. Collaborate with intake RN daily or as condition indicates. Assure the ED provider has rounded  once each shift. Provide and encourage hygiene. Provide redirection as needed. Assess for escalating behavior; address immediately and inform ED provider.  Assess family dynamic and appropriateness for visitation as needed: Yes. ; If necessary, describe findings:  Educate the patient/family about BHU procedures/visitation: Yes. ; If necessary, describe findings: Pt is calm and cooperative at this time. Pt understanding and accepting of unit procedures/rules. Will continue to monitor with Q 15 min safety rounds and observation via security camera.     

## 2016-01-04 NOTE — BHH Counselor (Addendum)
Pt spoke with Clinical research associatewriter and [facility].

## 2016-01-04 NOTE — ED Notes (Signed)
Pt. Noted in the day room. No complaints or concerns voiced. No distress or abnormal behavior noted. Will continue to monitor with security cameras. Q 15 minute rounds continue. 

## 2016-01-04 NOTE — ED Notes (Signed)
Pt. Noted in room. No complaints or concerns voiced. No distress or abnormal behavior noted. Will continue to monitor with security cameras. Q 15 minute rounds continue. 

## 2016-01-04 NOTE — Consult Note (Addendum)
[facility] Face-to-Face Psychiatry Follow-up Psychiatry Note  Reason for Consult: SI with plan to "hang myself Referring Physician: Delman Kitten, MD  Patient Identification: Appolonia Ackert MRN:  [id number] Principal Diagnosis: <principal problem not specified> Diagnosis:   Patient Active Problem List   Diagnosis Date Noted  . Active labor at term [O80] 02/16/2015  . Migraine, unspecified, without mention of intractable migraine without mention of status migrainosus [G43.909] 08/15/2014  . 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 Abdominal pregnancy with intrauterine pregnancy [O00.01] 08/15/2014  . Chronic daily headache [R51] 08/15/2014  . SVD (spontaneous vaginal delivery) [O80] 02/05/2012    Total Time spent with patient: 30 minutes  Subjective:   Robin [name] is a 25 y.o. female patient admitted with MDD, recurrent severe and anxiety. Pt asked to speak with me briefly today to start a medication for anxiety and depression. Pt endorses to the [facility] Assessment counselor the following: "Pt endorses the following sxs of depression: insomnia, fatigue "I'm always tired but I can't go to sleep", sadness, worthlessness, anger and irritability. Pt reports one previous Inpatient admission to [facility] at the age of 87 due to threats to harm self. Pt denies any h/o suicide attempts. Pt reports h/o cutting and states "its been a while but, I've been feeling like that lately and I don't know why". The pt noted ativan did not help for anxiety. Pt would like to a medication to treat both disorders (depression and anxiety). There is a possible history of bipolar disorder as the pt endorses irritability, agitation, decreased sleep, grandiosity, impulsivity, distractibility, flight of ideas and talkativeness in the past. Pt is willing to start quetiapine.    Risk to Self: Suicidal Ideation: Yes-Currently Present Suicidal Intent: Yes-Currently Present Is patient at risk for suicide?: Yes Suicidal Plan?:  Yes-Currently Present Specify Current Suicidal Plan: "Hanging myself" Access to Means: Yes Specify Access to Suicidal Means: Access to means to hang self What has been your use of drugs/alcohol within the last 12 months?: Pt denies use How many times?:  (Previous threats to harm self) Other Self Harm Risks: hx of anxiety and depression Triggers for Past Attempts: Unpredictable Intentional Self Injurious Behavior: Cutting Risk to Others: Homicidal Ideation: No Thoughts of Harm to Others: No Current Homicidal Intent: No Current Homicidal Plan: No Access to Homicidal Means: No Identified Victim: NA History of harm to others?: No Assessment of Violence: None Noted Violent Behavior Description: NA Does patient have access to weapons?: No Criminal Charges Pending?: No Does patient have a court date: No Prior Inpatient Therapy: Prior Inpatient Therapy: Yes Prior Therapy Dates: age 89 Prior Therapy Facilty/Provider(s): [facility] Reason for Treatment: Threats of harming self Prior Outpatient Therapy: Prior Outpatient Therapy: Yes Prior Therapy Dates: ages [age] Prior Therapy Facilty/Provider(s): Not Provided Reason for Treatment: Anxiety, ADHD Does patient have an ACCT team?: No Does patient have Intensive In-House Services?  : No Does patient have [facility] services? : No Does patient have P4CC services?: No  Past Medical History:  Past Medical History  Diagnosis Date  . Pregnancy as incidental finding   . No pertinent past medical history   . ADHD (attention deficit hyperactivity disorder)   . Chronic bronchitis   . Eczema   . Headache(784.0)     miagraine  . Ovarian cyst   . Shortness of breath   . Normal pregnancy, first 02/04/2012  . SVD (spontaneous vaginal delivery) 02/05/2012  . Chronic bronchitis ([place])     2x a year. last episode 2.5 years ago  Past Surgical History  Procedure Laterality Date  . Tonsillectomy    . Wisdom tooth extraction    . Tonsillectomy      Family History:  Family History  Problem Relation Age of Onset  . Diabetes Mother   . Hyperlipidemia Mother   . Chronic bronchitis Mother   . Endometriosis Mother   . Migraines Mother   . Miscarriages / [place] Mother   . Cancer Maternal Aunt     renal  . Cancer Maternal Uncle     breast  . Mental illness Maternal Grandmother     bipolar, paranoid schizophrenia  . COPD Maternal Grandmother   . Depression Maternal Grandmother   . Heart disease Maternal Grandmother   . Hypertension Maternal Grandmother   . Hyperthyroidism Maternal Grandmother   . Cancer Maternal Grandmother     liver  . Migraines Maternal Grandmother   . Miscarriages / Stillbirths Maternal Grandmother   . Heart disease Paternal Grandmother   . Heart disease Paternal Grandfather   . Heart attack Paternal Grandfather   . Stroke Paternal Grandfather   . Heart disease Father   . Obesity Father   . Hypertension Father     Social History:  History  Alcohol Use No     History  Drug Use No    Social History   Social History  . Marital Status: Single    Spouse Name: N/A  . Number of Children: 2  . Years of Education: 11th   Occupational History  . na    Social History Main Topics  . Smoking status: Current Every Day Smoker -- 0.50 packs/day for 1 years    Types: Cigarettes    Last Attempt to Quit: 07/01/2011  . Smokeless tobacco: Never Used  . Alcohol Use: No  . Drug Use: No  . Sexual Activity: Yes    Birth Control/ Protection: Other-see comments   Other Topics Concern  . None   Social History Narrative   Additional Social History:    Pain Medications: None Reported Prescriptions: None Reported Over the Counter: None Reported History of alcohol / drug use?: No history of alcohol / drug abuse   Allergies:   Allergies  Allergen Reactions  . Penicillins Hives, Itching and Other (See Comments)    Has patient had a PCN reaction causing immediate rash, facial/tongue/throat  swelling, SOB or lightheadedness with hypotension: No Has patient had a PCN reaction causing severe rash involving mucus membranes or skin necrosis: No Has patient had a PCN reaction that required hospitalization No Has patient had a PCN reaction occurring within the last 10 years: No If all of the above answers are "NO", then may proceed with Cephalosporin use.    Labs:  Results for orders placed or performed during the hospital encounter of 01/02/16 (from the past 48 hour(s))  Comprehensive metabolic panel     Status: Abnormal   Collection Time: 01/02/16  8:18 PM  Result Value Ref Range   Sodium 137 135 - 145 mmol/L   Potassium 3.7 3.5 - 5.1 mmol/L   Chloride 105 101 - 111 mmol/L   CO2 25 22 - 32 mmol/L   Glucose, Bld 109 (H) 65 - 99 mg/dL   BUN 14 6 - 20 mg/dL   Creatinine, Ser 0.79 0.44 - 1.00 mg/dL   Calcium 9.1 8.9 - 10.3 mg/dL   Total Protein 7.2 6.5 - 8.1 g/dL   Albumin 4.3 3.5 - 5.0 g/dL   AST 19 15 - 41 U/L  ALT 18 14 - 54 U/L   Alkaline Phosphatase 93 38 - 126 U/L   Total Bilirubin 0.5 0.3 - 1.2 mg/dL   GFR calc non Af Amer >60 >60 mL/min   GFR calc Af Amer >60 >60 mL/min    Comment: (NOTE) The eGFR has been calculated using the CKD EPI equation. This calculation has not been validated in all clinical situations. eGFR's persistently <60 mL/min signify possible Chronic Kidney Disease.    Anion gap 7 5 - 15  Ethanol     Status: None   Collection Time: 01/02/16  8:18 PM  Result Value Ref Range   Alcohol, Ethyl (B) <5 <5 mg/dL    Comment:        LOWEST DETECTABLE LIMIT FOR SERUM ALCOHOL IS 5 mg/dL FOR MEDICAL PURPOSES ONLY   CBC with Diff     Status: Abnormal   Collection Time: 01/02/16  8:18 PM  Result Value Ref Range   WBC 9.3 3.6 - 11.0 K/uL   RBC 4.47 3.80 - 5.20 MIL/uL   Hemoglobin 12.1 12.0 - 16.0 g/dL   HCT 36.6 35.0 - 47.0 %   MCV 81.9 80.0 - 100.0 fL   MCH 27.0 26.0 - 34.0 pg   MCHC 33.0 32.0 - 36.0 g/dL   RDW 16.1 (H) 11.5 - 14.5 %   Platelets  350 150 - 440 K/uL   Neutrophils Relative % 69 %   Neutro Abs 6.4 1.4 - 6.5 K/uL   Lymphocytes Relative 26 %   Lymphs Abs 2.4 1.0 - 3.6 K/uL   Monocytes Relative 4 %   Monocytes Absolute 0.4 0.2 - 0.9 K/uL   Eosinophils Relative 1 %   Eosinophils Absolute 0.1 0 - 0.7 K/uL   Basophils Relative 0 %   Basophils Absolute 0.0 0 - 0.1 K/uL  Salicylate level     Status: None   Collection Time: 01/02/16  8:18 PM  Result Value Ref Range   Salicylate Lvl <8.5 2.8 - 30.0 mg/dL  Acetaminophen level     Status: Abnormal   Collection Time: 01/02/16  8:18 PM  Result Value Ref Range   Acetaminophen (Tylenol), Serum <10 (L) 10 - 30 ug/mL    Comment:        THERAPEUTIC CONCENTRATIONS VARY SIGNIFICANTLY. A RANGE OF 10-30 ug/mL MAY BE AN EFFECTIVE CONCENTRATION FOR MANY PATIENTS. HOWEVER, SOME ARE BEST TREATED AT CONCENTRATIONS OUTSIDE THIS RANGE. ACETAMINOPHEN CONCENTRATIONS >150 ug/mL AT 4 HOURS AFTER INGESTION AND >50 ug/mL AT 12 HOURS AFTER INGESTION ARE OFTEN ASSOCIATED WITH TOXIC REACTIONS.   Urine Drug Screen, Qualitative ([facility] only)     Status: None   Collection Time: 01/02/16  8:18 PM  Result Value Ref Range   Tricyclic, Ur Screen NONE DETECTED NONE DETECTED   Amphetamines, Ur Screen NONE DETECTED NONE DETECTED   MDMA (Ecstasy)Ur Screen NONE DETECTED NONE DETECTED   Cocaine Metabolite,Ur [place] NONE DETECTED NONE DETECTED   Opiate, Ur Screen NONE DETECTED NONE DETECTED   Phencyclidine (PCP) Ur S NONE DETECTED NONE DETECTED   Cannabinoid 50 Ng, Ur [place] NONE DETECTED NONE DETECTED   Barbiturates, Ur Screen NONE DETECTED NONE DETECTED   Benzodiazepine, Ur Scrn NONE DETECTED NONE DETECTED   Methadone Scn, Ur NONE DETECTED NONE DETECTED    Comment: (NOTE) 885  Tricyclics, urine               Cutoff 1000 ng/mL 200  Amphetamines, urine  Cutoff 1000 ng/mL 300  MDMA (Ecstasy), urine           Cutoff 500 ng/mL 400  Cocaine Metabolite, urine       Cutoff 300 ng/mL 500  Opiate,  urine                   Cutoff 300 ng/mL 600  Phencyclidine (PCP), urine      Cutoff 25 ng/mL 700  Cannabinoid, urine              Cutoff 50 ng/mL 800  Barbiturates, urine             Cutoff 200 ng/mL 900  Benzodiazepine, urine           Cutoff 200 ng/mL 1000 Methadone, urine                Cutoff 300 ng/mL 1100 1200 The urine drug screen provides only a preliminary, unconfirmed 1300 analytical test result and should not be used for non-medical 1400 purposes. Clinical consideration and professional judgment should 1500 be applied to any positive drug screen result due to possible 1600 interfering substances. A more specific alternate chemical method 1700 must be used in order to obtain a confirmed analytical result.  1800 Gas chromato graphy / mass spectrometry (GC/MS) is the preferred 1900 confirmatory method.   Pregnancy, urine POC     Status: None   Collection Time: 01/02/16  8:31 PM  Result Value Ref Range   Preg Test, Ur NEGATIVE NEGATIVE    Comment:        THE SENSITIVITY OF THIS METHODOLOGY IS >24 mIU/mL     Current Facility-Administered Medications  Medication Dose Route Frequency Provider Last Rate Last Dose  . hydrOXYzine (ATARAX/VISTARIL) tablet 25 mg  25 mg Oral TID PRN Donita Brooks, MD   25 mg at 01/04/16 1450  . nicotine (NICODERM CQ - dosed in mg/24 hours) patch 21 mg  21 mg Transdermal Daily Donita Brooks, MD   21 mg at 01/04/16 1436  . nicotine polacrilex (NICORETTE) gum 2 mg  2 mg Oral Q4H while awake Nance Pear, MD   2 mg at 01/03/16 2221  . traZODone (DESYREL) tablet 150 mg  150 mg Oral QHS Nance Pear, MD   150 mg at 01/03/16 2221   Current Outpatient Prescriptions  Medication Sig Dispense Refill  . ibuprofen (ADVIL,MOTRIN) 800 MG tablet Take 800 mg by mouth every 8 (eight) hours as needed (for migraines).      Musculoskeletal: Strength & Muscle Tone: within normal limits Gait & Station: normal Patient leans: N/A  Psychiatric Specialty  Exam: Review of Systems  Constitutional: Negative.   HENT: Negative.   Eyes: Negative.   Respiratory: Negative.   Cardiovascular: Negative.   Gastrointestinal: Negative.   Genitourinary: Negative.   Musculoskeletal: Negative.   Skin: Negative.   Neurological: Negative.   Endo/Heme/Allergies: Negative.   Psychiatric/Behavioral: Positive for depression. Negative for suicidal ideas, hallucinations, memory loss and substance abuse. The patient is nervous/anxious and has insomnia.     Blood pressure 101/64, pulse 96, temperature 98.1 F (36.7 C), temperature source Oral, resp. rate 18, height '5\' 3"'$  (1.6 m), weight 72.576 kg (160 lb), last menstrual period 12/30/2015, SpO2 100 %, unknown if currently breastfeeding.Body mass index is 28.35 kg/(m^2).  General Appearance: Casual  Eye Contact::  Good  Speech:  Clear and Coherent and Normal Rate  Volume:  Normal  Mood:  Depressed  Affect:  Depressed  Thought Process:  Coherent, Goal Directed, Intact, Linear and Logical  Orientation:  Full (Time, Place, and Person)  Thought Content:  Negative  Suicidal Thoughts:  No  Homicidal Thoughts:  No  Memory:  Immediate;   Good Recent;   Good Remote;   Good  Judgement:  Fair  Insight:  Fair  Psychomotor Activity:  Normal  Concentration:  Fair  Recall:  Good  Fund of Knowledge:Good  Language: Good  Akathisia:  Negative  Handed:  Unknown not asked   AIMS (if indicated):     Assets:  Communication Skills Desire for Improvement  ADL's:  Intact  Cognition: WNL  Sleep:      Treatment Plan Summary: Medication management 1. Bipolar disorder, mre depressed, severe: worsening and GAD  - Start quetiapine '50mg'$  po QHS for possible bipolar disorder.   - Start hydroxyzine '25mg'$  po TID/prn for anxiety.  2. Risks, benefits and side effects and discussed including but not limited to any and all black box warnings, weight gain, fatigue and metabolic derangements.   3. Continue IVC and will seek inpatient  psychiatric treatment facility.    Disposition: Recommend psychiatric Inpatient admission when medically cleared.  Donita Brooks 01/04/2016 6:21 PM

## 2016-01-04 NOTE — ED Notes (Signed)
Pt given info that she would be transported to [facility] and she is agreeable and cooperative

## 2016-01-04 NOTE — ED Notes (Signed)
ENVIRONMENTAL ASSESSMENT  Potentially harmful objects out of patient reach: Yes.  Personal belongings secured: Yes.  Patient dressed in hospital provided attire only: Yes.  Plastic bags out of patient reach: Yes.  Patient care equipment (cords, cables, call bells, lines, and drains) shortened, removed, or accounted for: Yes.  Equipment and supplies removed from bottom of stretcher: Yes.  Potentially toxic materials out of patient reach: Yes.  Sharps container removed or out of patient reach: Yes.   BEHAVIORAL HEALTH ROUNDING  Patient sleeping: No.  Patient alert and oriented: yes  Behavior appropriate: Yes. ; If no, describe:  Nutrition and fluids offered: Yes  Toileting and hygiene offered: Yes  Sitter present: not applicable, Q 15 min safety rounds and observation via security camera. Law enforcement present: Yes ODS  

## 2016-01-04 NOTE — BH Assessment (Signed)
Followed up with referrals: [facility] [phone] unable to reach anyone in their admission department [facility] ([phone] ext. [phone]), No Beds [facility]-([phone]), No Beds. On Waitlist [facility] ([phone]), don't accept adult Medicaid [place]-[phone]([id number]) - Was unable to reach anyone in their admission department [facility](Kimberly [phone])- No Beds

## 2016-01-04 NOTE — Progress Notes (Signed)
   01/04/16 2130  Clinical Encounter Type  Visited With Patient  Visit Type Initial;Psychological support;Spiritual support  Referral From Nurse  Consult/Referral To Chaplain  Spiritual Encounters  Spiritual Needs Prayer;Emotional  Stress Factors  Patient Stress Factors Health changes  Met w/patient to provide pastoral counsel & prayer. Provided Bible per her request. Chap. Shamara Soza G. [place]

## 2016-01-04 NOTE — ED Notes (Signed)
Family brought clothing for her to take to [facility]

## 2016-01-05 LAB — LIPID PANEL
CHOLESTEROL: 169 mg/dL (ref 0–200)
HDL: 46 mg/dL (ref >40)
LDL Cholesterol: 96 mg/dL (ref 0–99)
TRIGLYCERIDES: 136 mg/dL (ref <150)
Total CHOL/HDL Ratio: 3.7 RATIO
VLDL: 27 mg/dL (ref 0–40)

## 2016-01-05 LAB — HEMOGLOBIN A1C: Hgb A1c MFr Bld: 5.3 % (ref 4.0–6.0)

## 2016-01-05 MED ORDER — TRAZODONE HCL 150 MG PO TABS: 150.0000 mg | ORAL_TABLET | Freq: Every day | ORAL | Status: DC

## 2016-01-05 MED ORDER — QUETIAPINE FUMARATE 50 MG PO TABS: 50.0000 mg | ORAL_TABLET | Freq: Every day | ORAL | Status: DC

## 2016-01-05 NOTE — ED Notes (Signed)
BEHAVIORAL HEALTH ROUNDING Patient sleeping: Yes.   Patient alert and oriented: not applicable SLEEPING Behavior appropriate: Yes.  ; If no, describe: SLEEPING Nutrition and fluids offered: No SLEEPING Toileting and hygiene offered: NoSLEEPING Sitter present: not applicable, Q 15 min safety rounds and observation via security camera. Law enforcement present: Yes ODS 

## 2016-01-05 NOTE — ED Notes (Signed)
Patient somewhat irritable this morning. She states she still needs to go to another facility for inpatient treatment. Maintained on 15 minute checks and observation by security camera for safety.

## 2016-01-05 NOTE — Discharge Instructions (Signed)
Major Depressive Disorder Major depressive disorder is a mental illness. It also may be called clinical depression or unipolar depression. Major depressive disorder usually causes feelings of sadness, hopelessness, or helplessness. Some people with this disorder do not feel particularly sad but lose interest in doing things they used to enjoy (anhedonia). Major depressive disorder also can cause physical symptoms. It can interfere with work, school, relationships, and other normal everyday activities. The disorder varies in severity but is longer lasting and more serious than the sadness we all feel from time to time in our lives. Major depressive disorder often is triggered by stressful life events or major life changes. Examples of these triggers include divorce, loss of your job or home, a move, and the death of a family member or close friend. Sometimes this disorder occurs for no obvious reason at all. People who have family members with major depressive disorder or bipolar disorder are at higher risk for developing this disorder, with or without life stressors. Major depressive disorder can occur at any age. It may occur just once in your life (single episode major depressive disorder). It may occur multiple times (recurrent major depressive disorder). SYMPTOMS People with major depressive disorder have either anhedonia or depressed mood on nearly a daily basis for at least 2 weeks or longer. Symptoms of depressed mood include:  Feelings of sadness (blue or down in the dumps) or emptiness.  Feelings of hopelessness or helplessness.  Tearfulness or episodes of crying (may be observed by others).  Irritability (children and adolescents). In addition to depressed mood or anhedonia or both, people with this disorder have at least four of the following symptoms:  Difficulty sleeping or sleeping too much.   Significant change (increase or decrease) in appetite or weight.   Lack of energy or  motivation.  Feelings of guilt and worthlessness.   Difficulty concentrating, remembering, or making decisions.  Unusually slow movement (psychomotor retardation) or restlessness (as observed by others).   Recurrent wishes for death, recurrent thoughts of self-harm (suicide), or a suicide attempt. People with major depressive disorder commonly have persistent negative thoughts about themselves, other people, and the world. People with severe major depressive disorder may experiencedistorted beliefs or perceptions about the world (psychotic delusions). They also may see or hear things that are not real (psychotic hallucinations). DIAGNOSIS Major depressive disorder is diagnosed through an assessment by your health care provider. Your health care provider will ask aboutaspects of your daily life, such as mood,sleep, and appetite, to see if you have the diagnostic symptoms of major depressive disorder. Your health care provider may ask about your medical history and use of alcohol or drugs, including prescription medicines. Your health care provider also may do a physical exam and blood work. This is because certain medical conditions and the use of certain substances can cause major depressive disorder-like symptoms (secondary depression). Your health care provider also may refer you to a mental health specialist for further evaluation and treatment. TREATMENT It is important to recognize the symptoms of major depressive disorder and seek treatment. The following treatments can be prescribed for this disorder:   Medicine. Antidepressant medicines usually are prescribed. Antidepressant medicines are thought to correct chemical imbalances in the brain that are commonly associated with major depressive disorder. Other types of medicine may be added if the symptoms do not respond to antidepressant medicines alone or if psychotic delusions or hallucinations occur.  Talk therapy. Talk therapy can be  helpful in treating major depressive disorder by providing   support, education, and guidance. Certain types of talk therapy also can help with negative thinking (cognitive behavioral therapy) and with relationship issues that trigger this disorder (interpersonal therapy). A mental health specialist can help determine which treatment is best for you. Most people with major depressive disorder do well with a combination of medicine and talk therapy. Treatments involving electrical stimulation of the brain can be used in situations with extremely severe symptoms or when medicine and talk therapy do not work over time. These treatments include electroconvulsive therapy, transcranial magnetic stimulation, and vagal nerve stimulation.   This information is not intended to replace advice given to you by your health care provider. Make sure you discuss any questions you have with your health care provider.   Document Released: 04/09/2013 Document Revised: 01/03/2015 Document Reviewed: 04/09/2013 Elsevier Interactive Patient Education 2016 Elsevier Inc.  

## 2016-01-05 NOTE — ED Provider Notes (Signed)
-----------------------------------------   7:02 AM on 01/05/2016 -----------------------------------------   Blood pressure 116/58, pulse 87, temperature 97.8 F (36.6 C), temperature source Oral, resp. rate 18, height 5\' 3"  (1.6 m), weight 160 lb (72.576 kg), last menstrual period 12/30/2015, SpO2 100 %, unknown if currently breastfeeding.  The patient had no acute events since last update.  Calm and cooperative at this time.  Disposition is pending per Psychiatry/Behavioral Medicine team recommendations.     Irean HongJade J Tyana Butzer, MD 01/05/16 [phone]

## 2016-01-05 NOTE — ED Notes (Signed)
Patient asleep in room. No noted distress or abnormal behavior. Will continue 15 minute checks and observation by security cameras for safety. 

## 2016-01-05 NOTE — [facility] Counselor (Signed)
Pt is to be D/C per Dr.Faheem. Writer has notified Pt, ED Sec. Misty Stanley(Lisa) and EDP (Dr.Kinner). Pt also requested to be d/c with home prescriptions. Writer informed Dr.Faheem, as well as Dr.Clapacs who stated he will print prescriptions.

## 2016-01-05 NOTE — ED Notes (Signed)
ENVIRONMENTAL ASSESSMENT Potentially harmful objects out of patient reach: Yes Personal belongings secured: Yes Patient dressed in hospital provided attire only: Yes Plastic bags out of patient reach: Yes Patient care equipment (cords, cables, call bells, lines, and drains) shortened, removed, or accounted for: Yes Equipment and supplies removed from bottom of stretcher: Yes Potentially toxic materials out of patient reach: Yes Sharps container removed or out of patient reach: Yes  Maintained on 15 minute checks and observation by security camera for safety.  

## 2016-01-05 NOTE — ED Notes (Signed)
Patient in dayroom. She is upset because she stated she was told she would go to [facility] but instead is being discharged. She wanted to go to [facility] because she has become friends with another patient who is going there.  Patient asked for and received PRN Vistaril to help reduce anxiety. Maintained on 15 minute checks and observation by security camera for safety.

## 2016-01-05 NOTE — Consult Note (Signed)
Robin [name]  01/05/2016 11:10 AM Robin [name]  MRN:  [id number] Subjective:   Robin [name] is a 25 y.o. female patient admitted with MDD, recurrent severe and anxiety. She is was seen for follow-up. She reported that she came to the hospital as she was feeling depressed and anxious and was having some anxiety. Patient reported that she thinks a lot and then start feeling anxiety and having a panic attack. However she was unable to explain in detail about her panic attacks as well as about her anxiety symptoms. She reported that she was prescribed Seroquel Vistaril and trazodone in the past. Patient reported that she has 2 small children ages 37 and 58-year-old which are being taking care of by her grandmother and mother. She reported that she wants to go to [facility] so she can get some time off from her family. She stated that she is relaxing in the hospital and this is a good time off. She currently denied having any suicidal ideations or plans. She denied having any perceptual disturbances. She appeared calm and collective during the interview.    Principal Problem: Mood Disorder Diagnosis:   Patient Active Problem List   Diagnosis Date Noted  . Active labor at term [O80] 02/16/2015  . Migraine, unspecified, without mention of intractable migraine without mention of status migrainosus [G43.909] 08/15/2014  . 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 Abdominal pregnancy with intrauterine pregnancy [O00.01] 08/15/2014  . Chronic daily headache [R51] 08/15/2014  . SVD (spontaneous vaginal delivery) [O80] 02/05/2012   Total Time spent with patient: 30 minutes  Past Psychiatric History: SHe reported history of psychiatric hospitalization at the age 67 in [facility]  Past Medical History:  Past Medical History  Diagnosis Date  . Pregnancy as incidental finding   . No pertinent past medical history   . ADHD (attention deficit hyperactivity disorder)   .  Chronic bronchitis   . Eczema   . Headache(784.0)     miagraine  . Ovarian cyst   . Shortness of breath   . Normal pregnancy, first 02/04/2012  . SVD (spontaneous vaginal delivery) 02/05/2012  . Chronic bronchitis (HCC)     2x a year. last episode 2.5 years ago    Past Surgical History  Procedure Laterality Date  . Tonsillectomy    . Wisdom tooth extraction    . Tonsillectomy     Family History:  Family History  Problem Relation Age of Onset  . Diabetes Mother   . Hyperlipidemia Mother   . Chronic bronchitis Mother   . Endometriosis Mother   . Migraines Mother   . Miscarriages / [place] Mother   . Cancer Maternal Aunt     renal  . Cancer Maternal Uncle     breast  . Mental illness Maternal Grandmother     bipolar, paranoid schizophrenia  . COPD Maternal Grandmother   . Depression Maternal Grandmother   . Heart disease Maternal Grandmother   . Hypertension Maternal Grandmother   . Hyperthyroidism Maternal Grandmother   . Cancer Maternal Grandmother     liver  . Migraines Maternal Grandmother   . Miscarriages / Stillbirths Maternal Grandmother   . Heart disease Paternal Grandmother   . Heart disease Paternal Grandfather   . Heart attack Paternal Grandfather   . Stroke Paternal Grandfather   . Heart disease Father   . Obesity Father   . Hypertension Father    Family Psychiatric  History: none  Social History:  History  Alcohol Use No     History  Drug Use No    Social History   Social History  . Marital Status: Single    Spouse Name: N/A  . Number of Children: 2  . Years of Education: 11th   Occupational History  . na    Social History Main Topics  . Smoking status: Current Every Day Smoker -- 0.50 packs/day for 1 years    Types: Cigarettes    Last Attempt to Quit: 07/01/2011  . Smokeless tobacco: Never Used  . Alcohol Use: No  . Drug Use: No  . Sexual Activity: Yes    Birth Control/ Protection: Other-see comments   Other Topics Concern  .  None   Social History Narrative    Sleep: Fair  Appetite:  Fair  Current Medications: Current Facility-Administered Medications  Medication Dose Route Frequency Provider Last Rate Last Dose  . hydrOXYzine (ATARAX/VISTARIL) tablet 25 mg  25 mg Oral TID PRN Gena Frayyan G McQueen, MD   25 mg at 01/05/16 1041  . nicotine (NICODERM CQ - dosed in mg/24 hours) patch 21 mg  21 mg Transdermal Daily Gena Frayyan G McQueen, MD   21 mg at 01/05/16 1032  . nicotine polacrilex (NICORETTE) gum 2 mg  2 mg Oral Q4H while awake Phineas SemenGraydon Goodman, MD   2 mg at 01/03/16 2221  . QUEtiapine (SEROQUEL) tablet 50 mg  50 mg Oral QHS Gena Frayyan G McQueen, MD   50 mg at 01/04/16 2204  . traZODone (DESYREL) tablet 150 mg  150 mg Oral QHS Phineas SemenGraydon Goodman, MD   150 mg at 01/04/16 2204   Current Outpatient Prescriptions  Medication Sig Dispense Refill  . ibuprofen (ADVIL,MOTRIN) 800 MG tablet Take 800 mg by mouth every 8 (eight) hours as needed (for migraines).      Lab Results:  Results for orders placed or performed during the hospital encounter of 01/02/16 (from the past 48 hour(s))  Lipid panel     Status: None   Collection Time: 01/05/16  6:36 AM  Result Value Ref Range   Cholesterol 169 0 - 200 mg/dL   Triglycerides 409136 <811<150 mg/dL   HDL 46 >91>40 mg/dL   Total CHOL/HDL Ratio 3.7 RATIO   VLDL 27 0 - 40 mg/dL   LDL Cholesterol 96 0 - 99 mg/dL    Comment:        Total Cholesterol/HDL:CHD Risk Coronary Heart Disease Risk Table                     Men   Women  1/2 Average Risk   3.4   3.3  Average Risk       5.0   4.4  2 X Average Risk   9.6   7.1  3 X Average Risk  23.4   11.0        Use the calculated Patient Ratio above and the CHD Risk Table to determine the patient's CHD Risk.        ATP III CLASSIFICATION (LDL):  <100     mg/dL   Optimal  [id number]  mg/dL   Near or Above                    Optimal  130-159  mg/dL   Borderline  [id number]  mg/dL   High  >657>190     mg/dL   Very High     Physical Findings: AIMS:  , ,   ,  ,  CIWA:    COWS:     Musculoskeletal: Strength & Muscle Tone: within normal limits Gait & Station: normal Patient leans: N/A  Psychiatric Specialty Exam: Review of Systems  Psychiatric/Behavioral: Positive for depression. The patient has insomnia.     Blood pressure 106/60, pulse 79, temperature 97.4 F (36.3 C), temperature source Oral, resp. rate 20, height 5\' 3"  (1.6 m), weight 160 lb (72.576 kg), last menstrual period 12/30/2015, SpO2 98 %, unknown if currently breastfeeding.Body mass index is 28.35 kg/(m^2).  General Appearance: Casual  Eye Contact::  Fair  Speech:  Clear and Coherent  Volume:  Decreased  Mood:  Anxious  Affect:  Congruent  Thought Process:  Coherent and Goal Directed  Orientation:  Full (Time, Place, and Person)  Thought Content:  WDL  Suicidal Thoughts:  No  Homicidal Thoughts:  No  Memory:  Immediate;   Fair  Judgement:  Fair  Insight:  Fair  Psychomotor Activity:  Normal  Concentration:  Fair  Recall:  [employer] of Knowledge:Fair  Language: Fair  Akathisia:  No  Handed:  Right  AIMS (if indicated):     Assets:  Communication Skills Intimacy Leisure Time Physical Health Social Support  ADL's:  Intact  Cognition: WNL  Sleep:      Treatment Plan Summary: Medication management   Patient will be released from the involuntary commitment as she does not meet the criteria. She will be discharged and will be provided outpatient resources so she can follow up with the outpatient community provider and continue her medications as prescribed  Brandy Hale, MD  01/05/2016, 11:10 AM

## 2016-01-05 NOTE — ED Notes (Signed)
Pt. Noted in room resting quietly;. No complaints or concerns voiced. No distress or abnormal behavior noted. Will continue to monitor with security cameras. Q 15 minute rounds continue. 

## 2016-01-05 NOTE — ED Notes (Signed)
Patient discharged ambulatory to home, accompanied by grandmother. She denies SI or HI. Discharge instructions reviewed with patient, she verbalizes understanding. Patient given information about [facility]. Patient received copy of DC instructions and all personal belongings.

## 2016-01-05 NOTE — ED Notes (Signed)
Discharge instructions reviewed with patient. She is waiting for prescriptions from MD. Plan is for her to call family to pick her up. Maintained on 15 minute checks and observation by security camera for safety.

## 2016-01-05 NOTE — ED Provider Notes (Signed)
Patient cleared for discharge by Dr. Zenaida NieceFaheem  Kanaya Gunnarson, MD 01/05/16 1113

## 2016-01-23 ENCOUNTER — Emergency Department (HOSPITAL_COMMUNITY)
Admission: EM | Admit: 2016-01-23 | Discharge: 2016-01-23 | Disposition: A | Discharge status: Home / Self Care | Payer: Medicaid Other | Attending: Emergency Medicine | Admitting: Emergency Medicine

## 2016-01-23 ENCOUNTER — Encounter (HOSPITAL_COMMUNITY): Payer: Self-pay

## 2016-01-23 ENCOUNTER — Emergency Department (HOSPITAL_COMMUNITY): Payer: Medicaid Other

## 2016-01-23 DIAGNOSIS — Z8659 Personal history of other mental and behavioral disorders: Secondary | ICD-10-CM | POA: Insufficient documentation

## 2016-01-23 DIAGNOSIS — Y998 Other external cause status: Secondary | ICD-10-CM | POA: Insufficient documentation

## 2016-01-23 DIAGNOSIS — Z8709 Personal history of other diseases of the respiratory system: Secondary | ICD-10-CM | POA: Diagnosis not present

## 2016-01-23 DIAGNOSIS — Z8742 Personal history of other diseases of the female genital tract: Secondary | ICD-10-CM | POA: Insufficient documentation

## 2016-01-23 DIAGNOSIS — Y9389 Activity, other specified: Secondary | ICD-10-CM | POA: Diagnosis not present

## 2016-01-23 DIAGNOSIS — Z872 Personal history of diseases of the skin and subcutaneous tissue: Secondary | ICD-10-CM | POA: Insufficient documentation

## 2016-01-23 DIAGNOSIS — Z8679 Personal history of other diseases of the circulatory system: Secondary | ICD-10-CM | POA: Insufficient documentation

## 2016-01-23 DIAGNOSIS — Y9289 Other specified places as the place of occurrence of the external cause: Secondary | ICD-10-CM | POA: Diagnosis not present

## 2016-01-23 DIAGNOSIS — F17210 Nicotine dependence, cigarettes, uncomplicated: Secondary | ICD-10-CM | POA: Insufficient documentation

## 2016-01-23 DIAGNOSIS — W010XXA Fall on same level from slipping, tripping and stumbling without subsequent striking against object, initial encounter: Secondary | ICD-10-CM | POA: Insufficient documentation

## 2016-01-23 DIAGNOSIS — S63502A Unspecified sprain of left wrist, initial encounter: Secondary | ICD-10-CM

## 2016-01-23 DIAGNOSIS — Z880 Allergy status to penicillin: Secondary | ICD-10-CM | POA: Insufficient documentation

## 2016-01-23 DIAGNOSIS — S6992XA Unspecified injury of left wrist, hand and finger(s), initial encounter: Secondary | ICD-10-CM | POA: Diagnosis present

## 2016-01-23 MED ORDER — IBUPROFEN 800 MG PO TABS
800.0000 mg | ORAL_TABLET | Freq: Once | ORAL | Status: AC
  Administered 2016-01-23: 800 mg via ORAL
  Filled 2016-01-23: qty 1

## 2016-01-23 MED ORDER — TRAMADOL HCL 50 MG PO TABS: 50.0000 mg | ORAL_TABLET | Freq: Four times a day (QID) | ORAL | Status: DC | PRN

## 2016-01-23 MED ORDER — TRAMADOL HCL 50 MG PO TABS
50.0000 mg | ORAL_TABLET | Freq: Once | ORAL | Status: AC
  Administered 2016-01-23: 50 mg via ORAL
  Filled 2016-01-23: qty 1

## 2016-01-23 MED ORDER — IBUPROFEN 600 MG PO TABS: 600.0000 mg | ORAL_TABLET | Freq: Four times a day (QID) | ORAL | Status: DC | PRN

## 2016-01-23 NOTE — ED Notes (Addendum)
Documented in error, wrong pt. 

## 2016-01-23 NOTE — ED Notes (Signed)
Pt alert & oriented x4, stable gait. Patient given discharge instructions, paperwork & prescription(s). Patient  instructed to stop at the registration desk to finish any additional paperwork. Patient verbalized understanding. Pt left department w/ no further questions. 

## 2016-01-23 NOTE — Discharge Instructions (Signed)
Wrist Sprain A wrist sprain is a stretch or tear in the strong, fibrous tissues (ligaments) that connect your wrist bones. The ligaments of your wrist may be easily sprained. There are three types of wrist sprains.  Grade 1. The ligament is not stretched or torn, but the sprain causes pain.  Grade 2. The ligament is stretched or partially torn. You may be able to move your wrist, but not very much.  Grade 3. The ligament or muscle completely tears. You may find it difficult or extremely painful to move your wrist even a little. CAUSES Often, wrist sprains are a result of a fall or an injury. The force of the impact causes the fibers of your ligament to stretch too much or tear. Common causes of wrist sprains include:  Overextending your wrist while catching a ball with your hands.  Repetitive or strenuous extension or bending of your wrist.  Landing on your hand during a fall. RISK FACTORS  Having previous wrist injuries.  Playing contact sports, such as boxing or wrestling.  Participating in activities in which falling is common.  Having poor wrist strength and flexibility. SIGNS AND SYMPTOMS  Wrist pain.  Wrist tenderness.  Inflammation or bruising of the wrist area.  Hearing a "pop" or feeling a tear at the time of the injury.  Decreased wrist movement due to pain, stiffness, or weakness. DIAGNOSIS Your health care provider will examine your wrist. In some cases, an X-ray will be taken to make sure you did not break any bones. If your health care provider thinks that you tore a ligament, he or she may order an MRI of your wrist. TREATMENT Treatment involves resting and icing your wrist. You may also need to take pain medicines to help lessen pain and inflammation. Your health care provider may recommend keeping your wrist still (immobilized) with a splint to help your sprain heal. When the splint is no longer necessary, you may need to perform strengthening and stretching  exercises. These exercises help you to regain strength and full range of motion in your wrist. Surgery is not usually needed for wrist sprains unless the ligament completely tears. HOME CARE INSTRUCTIONS  Rest your wrist. Do not do things that cause pain.  Wear your wrist splint as directed by your health care provider.  Take medicines only as directed by your health care provider.  To ease pain and swelling, apply ice to the injured area.  Put ice in a plastic bag.  Place a towel between your skin and the bag.  Leave the ice on for 20 minutes, 2-3 times a day. SEEK MEDICAL CARE IF:  Your pain, discomfort, or swelling gets worse even with treatment.  You feel sudden numbness in your hand.   This information is not intended to replace advice given to you by your health care provider. Make sure you discuss any questions you have with your health care provider.   Document Released: 08/16/2014 Document Reviewed: 08/16/2014 Elsevier Interactive Patient Education [employer]    Use ice and elevation as much as possible for the next several days to help reduce the swelling.  Take the medications prescribed.  You may take the tramadol prescribed for pain relief.  This will make you drowsy - do not drive within 4 hours of taking this medication.  Use the ibuprofen also for inflammation.  Call the orthopedic doctor listed for a recheck of your injury in 1 week if your symptoms are not improving.  Your xrays  are negative tonight for any sign of fracture or dislocation.

## 2016-01-23 NOTE — ED Notes (Signed)
Pt reports tripped over something in her driveway and fell.  C/O pain to left wrist.  Pt says unable to move fingers.  Capillary refill wnl, radial pulse present.

## 2016-01-24 NOTE — ED Provider Notes (Signed)
CSN: [id number]     Arrival date & time 01/23/16  1621 History   First MD Initiated Contact with Patient 01/23/16 1635     Chief Complaint  Patient presents with  . Wrist Pain     (Consider location/radiation/quality/duration/timing/severity/associated sxs/prior Treatment) The history is provided by the patient.   Robin [name] is a 25 y.o. right handed female presenting with left wrist pain since tripping and landing on her outstretched left hand prior to arrival.  She reports pain with attempts to flex and extend her fingers and wrist.  She denies numbness in her fingertips. She also reports pain in her elbow but can flex the joint more comfortably then the wrist. She has had no treatment prior to arrival here. She denies head injury.    Past Medical History  Diagnosis Date  . Pregnancy as incidental finding   . No pertinent past medical history   . ADHD (attention deficit hyperactivity disorder)   . Chronic bronchitis   . Eczema   . Headache(784.0)     miagraine  . Ovarian cyst   . Shortness of breath   . Normal pregnancy, first 02/04/2012  . SVD (spontaneous vaginal delivery) 02/05/2012  . Chronic bronchitis (HCC)     2x a year. last episode 2.5 years ago   Past Surgical History  Procedure Laterality Date  . Tonsillectomy    . Wisdom tooth extraction    . Tonsillectomy     Family History  Problem Relation Age of Onset  . Diabetes Mother   . Hyperlipidemia Mother   . Chronic bronchitis Mother   . Endometriosis Mother   . Migraines Mother   . Miscarriages / [place] Mother   . Cancer Maternal Aunt     renal  . Cancer Maternal Uncle     breast  . Mental illness Maternal Grandmother     bipolar, paranoid schizophrenia  . COPD Maternal Grandmother   . Depression Maternal Grandmother   . Heart disease Maternal Grandmother   . Hypertension Maternal Grandmother   . Hyperthyroidism Maternal Grandmother   . Cancer Maternal Grandmother     liver  . Migraines Maternal  Grandmother   . Miscarriages / Stillbirths Maternal Grandmother   . Heart disease Paternal Grandmother   . Heart disease Paternal Grandfather   . Heart attack Paternal Grandfather   . Stroke Paternal Grandfather   . Heart disease Father   . Obesity Father   . Hypertension Father    Social History  Substance Use Topics  . Smoking status: Current Every Day Smoker -- 0.50 packs/day for 1 years    Types: Cigarettes    Last Attempt to Quit: 07/01/2011  . Smokeless tobacco: Never Used  . Alcohol Use: No   OB History    Gravida Para Term Preterm AB TAB SAB Ectopic Multiple Living   0 0 0 0 0 0 2     Review of Systems  Constitutional: Negative for fever.  Musculoskeletal: Positive for joint swelling and arthralgias. Negative for myalgias.  Neurological: Negative for weakness and numbness.      Allergies  Penicillins  Home Medications   Prior to Admission medications   Medication Sig Start Date End Date Taking? Authorizing Provider  QUEtiapine (SEROQUEL) 50 MG tablet Take 1 tablet (50 mg total) by mouth at bedtime. 01/05/16  Yes Audery Amel, MD  traZODone (DESYREL) 150 MG tablet Take 1 tablet (150 mg total) by mouth at bedtime. 01/05/16  Yes John T  Clapacs, MD  ibuprofen (ADVIL,MOTRIN) 600 MG tablet Take 1 tablet (600 mg total) by mouth every 6 (six) hours as needed. 01/23/16   Burgess Amor, PA-C  traMADol (ULTRAM) 50 MG tablet Take 1 tablet (50 mg total) by mouth every 6 (six) hours as needed. 01/23/16   Burgess Amor, PA-C   BP 130/84 mmHg  Pulse 106  Temp(Src) 99.5 F (37.5 C) (Oral)  Resp 20  Ht  (1.6 m)  Wt 69.4 kg  BMI 27.11 kg/m2  SpO2 100%  LMP 12/30/2015  Breastfeeding? No Physical Exam  Constitutional: She appears well-developed and well-nourished.  HENT:  Head: Atraumatic.  Neck: Normal range of motion.  Cardiovascular:  Pulses equal bilaterally  Musculoskeletal: She exhibits edema and tenderness.       Left wrist: She exhibits bony tenderness and  swelling. She exhibits no crepitus and no deformity.  ttp across dorsal left wrist, subtle edema in comparison to right.  Fingertip cap refill less than 3 sec with intact sensation.  Pt unwilling to flex/ext fingers secondary to pain.  No pain with palpation of fingers/hand/joints. No snuffbox tenderness. Elbow unremarkable, no edema.  No deformity.  Pt can pronate/supinate without pain.  Neurological: She is alert. She has normal strength. She displays normal reflexes. No sensory deficit.  Skin: Skin is warm and dry.  Psychiatric: She has a normal mood and affect.    ED Course  Procedures (including critical care time) Labs Review Labs Reviewed - No data to display  Imaging Review Dg Elbow Complete Left  01/23/2016  CLINICAL DATA:  Left wrist pain and elbow pain. Tripped and fell on driveway. EXAM: LEFT ELBOW - COMPLETE 3+ VIEW COMPARISON:  None. FINDINGS: There is no evidence of fracture, dislocation, or joint effusion. There is no evidence of arthropathy or other focal bone abnormality. Soft tissues are unremarkable. IMPRESSION: Negative. Electronically Signed   By: Charlett Nose M.D.   On: 01/23/2016 17:30   Dg Wrist Complete Left  01/23/2016  CLINICAL DATA:  Status post trip and fall today with a left wrist injury. Pain. Initial encounter. EXAM: LEFT WRIST - COMPLETE 3+ VIEW COMPARISON:  None. FINDINGS: There is no evidence of fracture or dislocation. There is no evidence of arthropathy or other focal bone abnormality. Soft tissues are unremarkable. IMPRESSION: Negative exam. Electronically Signed   By: Drusilla Kanner M.D.   On: 01/23/2016 17:28   I have personally reviewed and evaluated these images and lab results as part of my medical decision-making.   EKG Interpretation None      MDM   Final diagnoses:  Wrist sprain, left, initial encounter      Radiological studies were viewed, interpreted and considered during the medical decision making and disposition process. I agree  with radiologists reading.  Results were also discussed with patient.   RICE, ibuprofen, tramadol.  F/u with ortho if not improving over the next week, referral given.   Burgess Amor, PA-C 01/24/16 1324  Bethann Berkshire, MD 01/24/16 2040

## 2016-02-28 ENCOUNTER — Other Ambulatory Visit: Payer: Self-pay | Admitting: Psychiatry

## 2016-03-21 ENCOUNTER — Encounter (HOSPITAL_COMMUNITY): Payer: Self-pay | Admitting: Emergency Medicine

## 2016-03-21 ENCOUNTER — Emergency Department (HOSPITAL_COMMUNITY)
Admission: EM | Admit: 2016-03-21 | Discharge: 2016-03-21 | Disposition: A | Discharge status: Home / Self Care | Payer: Medicaid Other | Attending: Emergency Medicine | Admitting: Emergency Medicine

## 2016-03-21 DIAGNOSIS — Y929 Unspecified place or not applicable: Secondary | ICD-10-CM | POA: Diagnosis not present

## 2016-03-21 DIAGNOSIS — F17210 Nicotine dependence, cigarettes, uncomplicated: Secondary | ICD-10-CM | POA: Diagnosis not present

## 2016-03-21 DIAGNOSIS — Y990 Civilian activity done for income or pay: Secondary | ICD-10-CM | POA: Diagnosis not present

## 2016-03-21 DIAGNOSIS — S46811A Strain of other muscles, fascia and tendons at shoulder and upper arm level, right arm, initial encounter: Secondary | ICD-10-CM

## 2016-03-21 DIAGNOSIS — F909 Attention-deficit hyperactivity disorder, unspecified type: Secondary | ICD-10-CM | POA: Insufficient documentation

## 2016-03-21 DIAGNOSIS — X500XXA Overexertion from strenuous movement or load, initial encounter: Secondary | ICD-10-CM | POA: Insufficient documentation

## 2016-03-21 DIAGNOSIS — Y9389 Activity, other specified: Secondary | ICD-10-CM | POA: Insufficient documentation

## 2016-03-21 DIAGNOSIS — S46911A Strain of unspecified muscle, fascia and tendon at shoulder and upper arm level, right arm, initial encounter: Secondary | ICD-10-CM | POA: Diagnosis not present

## 2016-03-21 DIAGNOSIS — S4991XA Unspecified injury of right shoulder and upper arm, initial encounter: Secondary | ICD-10-CM | POA: Diagnosis present

## 2016-03-21 MED ORDER — NAPROXEN 500 MG PO TABS: 500.0000 mg | ORAL_TABLET | Freq: Two times a day (BID) | ORAL | Status: DC

## 2016-03-21 MED ORDER — NAPROXEN 250 MG PO TABS
500.0000 mg | ORAL_TABLET | Freq: Once | ORAL | Status: AC
  Administered 2016-03-21: 500 mg via ORAL
  Filled 2016-03-21: qty 2

## 2016-03-21 MED ORDER — TRAMADOL HCL 50 MG PO TABS
50.0000 mg | ORAL_TABLET | Freq: Once | ORAL | Status: AC
  Administered 2016-03-21: 50 mg via ORAL
  Filled 2016-03-21: qty 1

## 2016-03-21 MED ORDER — TRAMADOL HCL 50 MG PO TABS: 50.0000 mg | ORAL_TABLET | Freq: Four times a day (QID) | ORAL | Status: DC | PRN

## 2016-03-21 MED ORDER — METHOCARBAMOL 500 MG PO TABS: 500.0000 mg | ORAL_TABLET | Freq: Four times a day (QID) | ORAL | Status: AC

## 2016-03-21 MED ORDER — METHOCARBAMOL 500 MG PO TABS
500.0000 mg | ORAL_TABLET | Freq: Once | ORAL | Status: AC
  Administered 2016-03-21: 500 mg via ORAL
  Filled 2016-03-21: qty 1

## 2016-03-21 NOTE — ED Notes (Signed)
Pt states she has chronic bursitis of the right shoulder and recently started a new job lifting boxes and the pain has worsened.

## 2016-03-21 NOTE — Discharge Instructions (Signed)

## 2016-03-21 NOTE — ED Provider Notes (Signed)
CSN: [id number]     Arrival date & time 03/21/16  1442 History  By signing my name below, I, Doreatha Martin, attest that this documentation has been prepared under the direction and in the presence of Burgess Amor, PA-C. Electronically Signed: Doreatha Martin, ED Scribe. 03/21/2016. 4:04 PM.    Chief Complaint  Patient presents with  . Shoulder Pain   The history is provided by the patient. No language interpreter was used.   HPI Comments: Robin [name] is a 25 y.o. female with h/o bursitis who presents to the Emergency Department complaining of moderate, stabbing, sharp right shoulder pain onset 2 days ago. 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 No other injury, trauma or fall. Pt states her current pain feels similar to bursitis, but more severe with shooting pains down her arm. Pt states that her pain is worsened with arm and neck movement but denies neck pain. She states she took 800 mg ibuprofen with no relief of pain. She notes that her prior bursitis flare up was treated with antiinflammatories and a sling. Pt notes she was not able to follow up with orthopedics at that time. No h/o neck pain prior to previous bursa flare up. Pt is not currently followed by a PCP. She denies numbness.   Past Medical History  Diagnosis Date  . Pregnancy as incidental finding   . No pertinent past medical history   . ADHD (attention deficit hyperactivity disorder)   . Chronic bronchitis   . Eczema   . Headache(784.0)     miagraine  . Ovarian cyst   . Shortness of breath   . Normal pregnancy, first 02/04/2012  . SVD (spontaneous vaginal delivery) 02/05/2012  . Chronic bronchitis (HCC)     2x a year. last episode 2.5 years ago   Past Surgical History  Procedure Laterality Date  . Tonsillectomy    . Wisdom tooth extraction    . Tonsillectomy     Family History  Problem Relation Age of Onset  . Diabetes Mother   . Hyperlipidemia Mother   .  Chronic bronchitis Mother   . Endometriosis Mother   . Migraines Mother   . Miscarriages / [place] Mother   . Cancer Maternal Aunt     renal  . Cancer Maternal Uncle     breast  . Mental illness Maternal Grandmother     bipolar, paranoid schizophrenia  . COPD Maternal Grandmother   . Depression Maternal Grandmother   . Heart disease Maternal Grandmother   . Hypertension Maternal Grandmother   . Hyperthyroidism Maternal Grandmother   . Cancer Maternal Grandmother     liver  . Migraines Maternal Grandmother   . Miscarriages / Stillbirths Maternal Grandmother   . Heart disease Paternal Grandmother   . Heart disease Paternal Grandfather   . Heart attack Paternal Grandfather   . Stroke Paternal Grandfather   . Heart disease Father   . Obesity Father   . Hypertension Father    Social History  Substance Use Topics  . Smoking status: Current Every Day Smoker -- 0.50 packs/day for 1 years    Types: Cigarettes  . Smokeless tobacco: Never Used  . Alcohol Use: No   OB History    Gravida Para Term Preterm AB TAB SAB Ectopic Multiple Living   0 0 0 0 0 0 2     Review of Systems  Musculoskeletal: Positive for  arthralgias.  Neurological: Negative for numbness.   Allergies  Penicillins  Home Medications   Prior to Admission medications   Medication Sig Start Date End Date Taking? Authorizing Provider  ibuprofen (ADVIL,MOTRIN) 600 MG tablet Take 1 tablet (600 mg total) by mouth every 6 (six) hours as needed. 01/23/16   Burgess AmorJulie Zykerria Tanton, PA-C  methocarbamol (ROBAXIN) 500 MG tablet Take 1 tablet (500 mg total) by mouth 4 (four) times daily. 03/21/16 03/31/16  Burgess AmorJulie Emberlie Gotcher, PA-C  naproxen (NAPROSYN) 500 MG tablet Take 1 tablet (500 mg total) by mouth 2 (two) times daily. 03/21/16   Burgess AmorJulie Madysun Thall, PA-C  QUEtiapine (SEROQUEL) 50 MG tablet Take 1 tablet (50 mg total) by mouth at bedtime. 01/05/16   Audery AmelJohn T Clapacs, MD  traMADol (ULTRAM) 50 MG tablet Take 1 tablet (50 mg total) by mouth every 6  (six) hours as needed. 03/21/16   Burgess AmorJulie Eliezer Khawaja, PA-C  traZODone (DESYREL) 150 MG tablet Take 1 tablet (150 mg total) by mouth at bedtime. 01/05/16   Audery AmelJohn T Clapacs, MD   BP 123/68 mmHg  Pulse 89  Temp(Src) 98.1 F (36.7 C) (Oral)  Resp 18  Ht 5\' 3"  (1.6 m)  Wt 72.576 kg  BMI 28.35 kg/m2  SpO2 100%  LMP 03/14/2016 Physical Exam  Constitutional: She is oriented to person, place, and time. She appears well-developed and well-nourished.  HENT:  Head: Normocephalic and atraumatic.  Eyes: Conjunctivae and EOM are normal. Pupils are equal, round, and reactive to light.  Neck: Normal range of motion. Neck supple.  Cardiovascular: Normal rate.   Pulmonary/Chest: Effort normal. No respiratory distress.  Abdominal: She exhibits no distension.  Musculoskeletal: Normal range of motion. She exhibits tenderness.       Cervical back: Normal. She exhibits no bony tenderness.  Tender over the upper right trapezius with spasm appreciated. She has full range of cervical motion. Non-tender over bony shoulder and clavicle. Equal grip strength and 2+ bilateral bicep DTRs.  Pain worsens with left head rotation.  Neurological: She is alert and oriented to person, place, and time.  Skin: Skin is warm and dry.  Psychiatric: She has a normal mood and affect. Her behavior is normal.  Nursing note and vitals reviewed.   ED Course  Procedures (including critical care time) DIAGNOSTIC STUDIES: Oxygen Saturation is 100% on RA, normal by my interpretation.    COORDINATION OF CARE: 3:57 PM Discussed treatment plan with pt at bedside which includes symptomatic therapy and pt agreed to plan.   MDM   Final diagnoses:  Trapezius muscle strain, right, initial encounter    Pt advised heat tx followed by ROM stretching, demonstrated.  Naproxen, robaxin, tramadol.  Referral to establish primary care given.  Exam c/w muscle strain/spasm.  No neuro deficits, no h/o trauma.  I personally performed the services  described in this documentation, which was scribed in my presence. The recorded information has been reviewed and is accurate.   Burgess AmorJulie Emmilia Sowder, PA-C 03/21/16 1646  Benjiman CoreNathan Pickering, MD 03/21/16 [phone]

## 2016-03-31 ENCOUNTER — Emergency Department (HOSPITAL_COMMUNITY)
Admission: EM | Admit: 2016-03-31 | Discharge: 2016-03-31 | Disposition: A | Discharge status: Home / Self Care | Payer: Medicaid Other | Attending: Emergency Medicine | Admitting: Emergency Medicine

## 2016-03-31 ENCOUNTER — Encounter (HOSPITAL_COMMUNITY): Payer: Self-pay | Admitting: Emergency Medicine

## 2016-03-31 DIAGNOSIS — F17210 Nicotine dependence, cigarettes, uncomplicated: Secondary | ICD-10-CM | POA: Diagnosis not present

## 2016-03-31 DIAGNOSIS — R300 Dysuria: Secondary | ICD-10-CM | POA: Diagnosis not present

## 2016-03-31 DIAGNOSIS — R110 Nausea: Secondary | ICD-10-CM | POA: Diagnosis not present

## 2016-03-31 DIAGNOSIS — K5900 Constipation, unspecified: Secondary | ICD-10-CM | POA: Diagnosis not present

## 2016-03-31 DIAGNOSIS — K625 Hemorrhage of anus and rectum: Secondary | ICD-10-CM | POA: Diagnosis not present

## 2016-03-31 DIAGNOSIS — R109 Unspecified abdominal pain: Secondary | ICD-10-CM | POA: Diagnosis present

## 2016-03-31 DIAGNOSIS — F909 Attention-deficit hyperactivity disorder, unspecified type: Secondary | ICD-10-CM | POA: Insufficient documentation

## 2016-03-31 LAB — CBC WITH DIFFERENTIAL/PLATELET
BASOS ABS: 0 10*3/uL (ref 0.0–0.1)
Basophils Relative: 0 %
Eosinophils Absolute: 0.2 10*3/uL (ref 0.0–0.7)
Eosinophils Relative: 2 %
HEMATOCRIT: 35.2 % — AB (ref 36.0–46.0)
HEMOGLOBIN: 11.7 g/dL — AB (ref 12.0–15.0)
LYMPHS ABS: 2.3 10*3/uL (ref 0.7–4.0)
LYMPHS PCT: 31 %
MCH: 27.8 pg (ref 26.0–34.0)
MCHC: 33.2 g/dL (ref 30.0–36.0)
MCV: 83.6 fL (ref 78.0–100.0)
MONO ABS: 0.4 10*3/uL (ref 0.1–1.0)
MONOS PCT: 5 %
NEUTROS ABS: 4.5 10*3/uL (ref 1.7–7.7)
Neutrophils Relative %: 62 %
PLATELETS: 303 10*3/uL (ref 150–400)
RBC: 4.21 MIL/uL (ref 3.87–5.11)
RDW: 13.4 % (ref 11.5–15.5)
WBC: 7.4 10*3/uL (ref 4.0–10.5)

## 2016-03-31 LAB — COMPREHENSIVE METABOLIC PANEL
ALT: 14 U/L (ref 14–54)
ANION GAP: 10 (ref 5–15)
AST: 17 U/L (ref 15–41)
Albumin: 4.2 g/dL (ref 3.5–5.0)
Alkaline Phosphatase: 81 U/L (ref 38–126)
BUN: 11 mg/dL (ref 6–20)
CHLORIDE: 107 mmol/L (ref 101–111)
CO2: 23 mmol/L (ref 22–32)
CREATININE: 0.64 mg/dL (ref 0.44–1.00)
Calcium: 9.2 mg/dL (ref 8.9–10.3)
Glucose, Bld: 93 mg/dL (ref 65–99)
POTASSIUM: 3.8 mmol/L (ref 3.5–5.1)
SODIUM: 140 mmol/L (ref 135–145)
Total Bilirubin: 0.1 mg/dL — ABNORMAL LOW (ref 0.3–1.2)
Total Protein: 7.2 g/dL (ref 6.5–8.1)

## 2016-03-31 LAB — URINALYSIS, ROUTINE W REFLEX MICROSCOPIC
BILIRUBIN URINE: NEGATIVE
GLUCOSE, UA: NEGATIVE mg/dL
Ketones, ur: NEGATIVE mg/dL
Leukocytes, UA: NEGATIVE
Nitrite: NEGATIVE
PH: 7 (ref 5.0–8.0)
SPECIFIC GRAVITY, URINE: 1.02 (ref 1.005–1.030)

## 2016-03-31 LAB — POC OCCULT BLOOD, ED: FECAL OCCULT BLD: POSITIVE — AB

## 2016-03-31 LAB — URINE MICROSCOPIC-ADD ON

## 2016-03-31 LAB — PREGNANCY, URINE: Preg Test, Ur: NEGATIVE

## 2016-03-31 MED ORDER — NITROFURANTOIN MONOHYD MACRO 100 MG PO CAPS
100.0000 mg | ORAL_CAPSULE | Freq: Two times a day (BID) | ORAL | Status: DC
  Administered 2016-03-31: 100 mg via ORAL
  Filled 2016-03-31: qty 1

## 2016-03-31 MED ORDER — POLYETHYLENE GLYCOL 3350 17 GM/SCOOP PO POWD: 17.0000 g | Freq: Once | ORAL | Status: DC

## 2016-03-31 MED ORDER — NITROFURANTOIN MONOHYD MACRO 100 MG PO CAPS
100.0000 mg | ORAL_CAPSULE | Freq: Two times a day (BID) | ORAL | Status: DC
Start: 2016-03-31 — End: 2019-02-18

## 2016-03-31 MED ORDER — DOCUSATE SODIUM 100 MG PO CAPS: 100.0000 mg | ORAL_CAPSULE | Freq: Two times a day (BID) | ORAL | Status: DC

## 2016-03-31 NOTE — ED Provider Notes (Signed)
CSN: [id number]     Arrival date & time 03/31/16  1711 History  By signing my name below, I, Linus GalasMaharshi Patel, attest that this documentation has been prepared under the direction and in the presence of No att. providers found. Electronically Signed: Linus GalasMaharshi Patel, ED Scribe. 04/01/2016. 9:49 PM.   No chief complaint on file.  The history is provided by the patient. No language interpreter was used.  HPI Comments: Corky Craftsoel Guillen is a 25 y.o. female brought by mother to the Emergency Department  With no pertinent PMHx complaining of bright red blood in her stool today. Mother also reports constipation, nausea, abd pain, lower back pain, urinary frequency, and dysuria. Mother states pt noted blood in her stool and when wiping and toilet bowel stained pink. She states the pt was recently had an oral surgery and was placed on oxycodone for 4 days without any laxatives. Pt reports she normally has harder stools and has BM every few days. Typically strains with bowel movements. Pt denies any vomiting, fever, chest pain, sob or any other symptoms at this time.    Pt had a UTI during her pregnancy and does not associate these symptoms with a possible UTI.  Past Medical History  Diagnosis Date  . Pregnancy as incidental finding   . No pertinent past medical history   . ADHD (attention deficit hyperactivity disorder)   . Chronic bronchitis   . Eczema   . Headache(784.0)     miagraine  . Ovarian cyst   . Shortness of breath   . Normal pregnancy, first 02/04/2012  . SVD (spontaneous vaginal delivery) 02/05/2012  . Chronic bronchitis (HCC)     2x a year. last episode 2.5 years ago   Past Surgical History  Procedure Laterality Date  . Tonsillectomy    . Wisdom tooth extraction    . Tonsillectomy     Family History  Problem Relation Age of Onset  . Diabetes Mother   . Hyperlipidemia Mother   . Chronic bronchitis Mother   . Endometriosis Mother   . Migraines Mother   . M[place]  [place]   . Cancer Maternal Aunt     renal  . Cancer Maternal Uncle     breast  . Mental illness Maternal Grandmother     bipolar, paranoid schizophrenia  . COPD Maternal Grandmother   . Depression Maternal Grandmother   . Heart disease Maternal Grandmother   . Hypertension Maternal Grandmother   . Hyperthyroidism Maternal Grandmother   . Cancer Maternal Grandmother     liver  . Migraines Maternal Grandmother   . Miscarriages / Stillbirths Maternal Grandmother   . Heart disease Paternal Grandmother   . Heart disease Paternal Grandfather   . Heart attack Paternal Grandfather   . Stroke Paternal Grandfather   . Heart disease Father   . Obesity Father   . Hypertension Father    Social History  Substance Use Topics  . Smoking status: Current Every Day Smoker -- 0.50 packs/day for 1 years    Types: Cigarettes  . Smokeless tobacco: Never Used  . Alcohol Use: No   OB History    Gravida Para Term Preterm AB TAB SAB Ectopic Multiple Living   2 2 2  0 0 0 0 0 0 2     Review of Systems  Gastrointestinal: Positive for nausea, abdominal pain, constipation and blood in stool. Negative for vomiting.  Genitourinary: Positive for dysuria and frequency.  All other systems reviewed and are negative.  Allergies  Penicillins  Home Medications   Prior to Admission medications   Medication Sig Start Date End Date Taking? Authorizing Provider  docusate sodium (COLACE) 100 MG capsule Take 1 capsule (100 mg total) by mouth every 12 (twelve) hours. 03/31/16   Lavera Guise, MD  ibuprofen (ADVIL,MOTRIN) 600 MG tablet Take 1 tablet (600 mg total) by mouth every 6 (six) hours as needed. 01/23/16   Burgess Amor, PA-C  naproxen (NAPROSYN) 500 MG tablet Take 1 tablet (500 mg total) by mouth 2 (two) times daily. 03/21/16   Burgess Amor, PA-C  nitrofurantoin, macrocrystal-monohydrate, (MACROBID) 100 MG capsule Take 1 capsule (100 mg total) by mouth 2 (two) times daily. 03/31/16   Lavera Guise, MD  polyethylene  glycol powder (GLYCOLAX/MIRALAX) powder Take 17 g by mouth once. Dissolve 1 capful of powder into any liquid and drink once daily. If no good effect, take twice daily. 03/31/16   Lavera Guise, MD  QUEtiapine (SEROQUEL) 50 MG tablet Take 1 tablet (50 mg total) by mouth at bedtime. 01/05/16   Audery Amel, MD  traMADol (ULTRAM) 50 MG tablet Take 1 tablet (50 mg total) by mouth every 6 (six) hours as needed. 03/21/16   Burgess Amor, PA-C  traZODone (DESYREL) 150 MG tablet Take 1 tablet (150 mg total) by mouth at bedtime. 01/05/16   Audery Amel, MD   BP 126/78 mmHg  Pulse 89  Temp(Src) 98.2 F (36.8 C) (Oral)  Resp 16  Ht  (1.6 m)  Wt 158 lb (71.668 kg)  BMI 28.00 kg/m2  SpO2 100%  LMP 03/01/2016   Physical Exam  Nursing note and vitals reviewed. Constitutional: Well developed, well nourished, non-toxic, and in no acute distress Head: Normocephalic and atraumatic.  Mouth/Throat: Oropharynx is clear and moist.  Neck: Normal range of motion. Neck supple.  Cardiovascular: Normal rate and regular rhythm.   Pulmonary/Chest: Effort normal and breath sounds normal.  Abdominal: Soft, non-distended, mild diffuse tenderness, no rebound or guarding. There is a small rectal fissure, no stool but streak of blood on rectal exam  Musculoskeletal: Normal range of motion.  Neurological: Alert, no facial droop, fluent speech, moves all extremities symmetrically Skin: Skin is warm and dry.  Psychiatric: Cooperative  ED Course  Procedures   DIAGNOSTIC STUDIES: Oxygen Saturation is 100% on room air, normal by my interpretation.    COORDINATION OF CARE: 9:38 PM Will order urinalysis, pregnancy screen and blood work. Discussed treatment plan with pt and mother at bedside and they agreed to plan. Strict return precautions given.   Labs Review Labs Reviewed  URINALYSIS, ROUTINE W REFLEX MICROSCOPIC (NOT AT [facility]) - Abnormal; Notable for the following:    Hgb urine dipstick TRACE (*)    Protein, ur  TRACE (*)    All other components within normal limits  CBC WITH DIFFERENTIAL/PLATELET - Abnormal; Notable for the following:    Hemoglobin 11.7 (*)    HCT 35.2 (*)    All other components within normal limits  COMPREHENSIVE METABOLIC PANEL - Abnormal; Notable for the following:    Total Bilirubin 0.1 (*)    All other components within normal limits  URINE MICROSCOPIC-ADD ON - Abnormal; Notable for the following:    Squamous Epithelial / LPF 0-5 (*)    Bacteria, UA FEW (*)    All other components within normal limits  POC OCCULT BLOOD, ED - Abnormal; Notable for the following:    Fecal Occult Bld POSITIVE (*)    All  other components within normal limits  URINE CULTURE  PREGNANCY, URINE    Imaging Review No results found. I have personally reviewed and evaluated these images and lab results as part of my medical decision-making.   EKG Interpretation None      MDM   Final diagnoses:  BRBPR (bright red blood per rectum)  Constipation, unspecified constipation type    25 year old female who presents with bright red blood per rectum. She is nontoxic in no acute distress with normal vital signs on arrival. Her abdomen is overall soft and benign with some diffuse mild tenderness. Rectal exam revealing a rectal fissure as well as streak of blood without stool. Suspect that her rectal bleeding is likely from hemorrhoids as well as her rectal fissure. She seems to strain often with infrequent bowel movements in a week, and hard stools. Recent course of narcotic medications for her oral surgery also contributing to picture. Discussed bowel regimen and started on MiraLAX and Colace. She is not pregnant and her blood work overall is unremarkable. Urinalysis with significant amount of WBCs but no significant leukocytes or nitrites. Some bacteria noted. Given that she has symptoms of UTI we'll treat with a course of Macrobid. Presentation not concerning for that of pyelonephritis. Strict return  and follow-up instructions are reviewed with the patient and her mother. They express understanding of all discharge instructions, and felt comfortable with the plan of care.  I personally performed the services described in this documentation, which was scribed in my presence. The recorded information has been reviewed and is accurate.    Lavera Guise, MD 04/01/16 [phone]

## 2016-03-31 NOTE — ED Notes (Signed)
Pt states understanding of care given and follow up instructions.  Ambulated from ED with mother

## 2016-03-31 NOTE — ED Notes (Signed)
bright red blood per rectum, also complaining of frequency of urine, abdominal pain

## 2016-03-31 NOTE — Discharge Instructions (Signed)
Please start a bowel regimen to help you have more regular bowel movements that this is likely causing you hemorrhoids and fissures which is the source of your bleeding. Her blood work today overall looks unremarkable. He may also take Tylenol and Motrin as needed for pain. Return for worsening symptoms including vomiting unable to keep down food or fluids, worsening pain, fevers, or any other symptoms concerning to.  Anal Fissure, Adult An anal fissure is a small tear or crack in the skin around the anus. Bleeding from a fissure usually stops on its own within a few minutes. However, bleeding will often occur again with each bowel movement until the crack heals. CAUSES This condition may be caused by:  Passing large, hard stool (feces).  Frequent diarrhea.  Constipation.  Inflammatory bowel disease (Crohn disease or ulcerative colitis).  Infections.  Anal sex. SYMPTOMS Symptoms of this condition include:  Bleeding from the rectum.  Small amounts of blood seen on your stool, on toilet paper, or in the toilet after a bowel movement.  Painful bowel movements.  Itching or irritation around the anus. DIAGNOSIS A health care provider may diagnose this condition by closely examining the anal area. An anal fissure can usually be seen with careful inspection. In some cases, a rectal exam may be performed, or a short tube (anoscope) may be used to examine the anal canal. TREATMENT Treatment for this condition may include:  Taking steps to avoid constipation. This may include making changes to your diet, such as increasing your intake of fiber or fluid.  Taking fiber supplements. These supplements can soften your stool to help make bowel movements easier. Your health care provider may also prescribe a stool softener if your stool is often hard.  Taking sitz baths. This may help to heal the tear.  Using medicated creams or ointments. These may be prescribed to lessen discomfort. HOME  CARE INSTRUCTIONS Eating and Drinking  Avoid foods that may be constipating, such as bananas and dairy products.  Drink enough fluid to keep your urine clear or pale yellow.  Maintain a diet that is high in fruits, whole grains, and vegetables. General Instructions  Keep the anal area as clean and dry as possible.  Take sitz baths as told by your health care provider. Do not use soap in the sitz baths.  Take over-the-counter and prescription medicines only as told by your health care provider.  Use creams or ointments only as told by your health care provider.  Keep all follow-up visits as told by your health care provider. This is important. SEEK MEDICAL CARE IF:  You have more bleeding.  You have a fever.  You have diarrhea that is mixed with blood.  You continue to have pain.  Your problem is getting worse rather than better.   This information is not intended to replace advice given to you by your health care provider. Make sure you discuss any questions you have with your health care provider.   Document Released: 12/13/2005 Document Revised: 09/03/2015 Document Reviewed: 03/10/2015 Elsevier Interactive Patient Education 2016 [employer]  Constipation, Adult Constipation is when a person has fewer than three bowel movements a week, has difficulty having a bowel movement, or has stools that are dry, hard, or larger than normal. As people grow older, constipation is more common. A low-fiber diet, not taking in enough fluids, and taking certain medicines may make constipation worse.  CAUSES   Certain medicines, such as antidepressants, pain medicine, iron supplements, antacids, and  water pills.   Certain diseases, such as diabetes, irritable bowel syndrome (IBS), thyroid disease, or depression.   Not drinking enough water.   Not eating enough fiber-rich foods.   Stress or travel.   Lack of physical activity or exercise.   Ignoring the urge to have a  bowel movement.   Using laxatives too much.  SIGNS AND SYMPTOMS   Having fewer than three bowel movements a week.   Straining to have a bowel movement.   Having stools that are hard, dry, or larger than normal.   Feeling full or bloated.   Pain in the lower abdomen.   Not feeling relief after having a bowel movement.  DIAGNOSIS  Your health care provider will take a medical history and perform a physical exam. Further testing may be done for severe constipation. Some tests may include:  A barium enema X-ray to examine your rectum, colon, and, sometimes, your small intestine.   A sigmoidoscopy to examine your lower colon.   A colonoscopy to examine your entire colon. TREATMENT  Treatment will depend on the severity of your constipation and what is causing it. Some dietary treatments include drinking more fluids and eating more fiber-rich foods. Lifestyle treatments may include regular exercise. If these diet and lifestyle recommendations do not help, your health care provider may recommend taking over-the-counter laxative medicines to help you have bowel movements. Prescription medicines may be prescribed if over-the-counter medicines do not work.  HOME CARE INSTRUCTIONS   Eat foods that have a lot of fiber, such as fruits, vegetables, whole grains, and beans.  Limit foods high in fat and processed sugars, such as french fries, hamburgers, cookies, candies, and soda.   A fiber supplement may be added to your diet if you cannot get enough fiber from foods.   Drink enough fluids to keep your urine clear or pale yellow.   Exercise regularly or as directed by your health care provider.   Go to the restroom when you have the urge to go. Do not hold it.   Only take over-the-counter or prescription medicines as directed by your health care provider. Do not take other medicines for constipation without talking to your health care provider first.  SEEK IMMEDIATE MEDICAL  CARE IF:   You have bright red blood in your stool.   Your constipation lasts for more than 4 days or gets worse.   You have abdominal or rectal pain.   You have thin, pencil-like stools.   You have unexplained weight loss. MAKE SURE YOU:   Understand these instructions.  Will watch your condition.  Will get help right away if you are not doing well or get worse.   This information is not intended to replace advice given to you by your health care provider. Make sure you discuss any questions you have with your health care provider.   Document Released: 09/10/2004 Document Revised: 01/03/2015 Document Reviewed: 09/24/2013 Elsevier Interactive Patient Education [employer]

## 2016-04-03 LAB — URINE CULTURE

## 2016-04-04 ENCOUNTER — Telehealth: Payer: Self-pay

## 2016-04-04 NOTE — Telephone Encounter (Signed)
Post ED Visit - Positive Culture Follow-up  Culture report reviewed by antimicrobial stewardship pharmacist:  []  Enzo BiNathan Batchelder, Pharm.D. []  Celedonio MiyamotoJeremy Frens, Pharm.D., BCPS []  Garvin FilaMike Maccia, Pharm.D. []  Georgina PillionElizabeth Martin, Pharm.D., BCPS []  ProvidenceMinh Pham, VermontPharm.D., BCPS, AAHIVP []  Estella HuskMichelle Turner, Pharm.D., BCPS, AAHIVP []  Tennis Mustassie Stewart, Pharm.D. []  Rob Oswaldo DoneVincent, [address]Pharm[address] Leone HavenAubrey Jones Pharm D Positive urine culture Treated with nitfurantoin, organism sensitive to the same and no further patient follow-up is required at this time.  Jerry CarasCullom, Shi Blankenship Burnett 04/04/2016, 1:47 PM

## 2016-05-21 ENCOUNTER — Ambulatory Visit: Payer: Medicaid Other | Admitting: Neurology

## 2016-05-21 ENCOUNTER — Telehealth: Payer: Self-pay | Admitting: Neurology

## 2016-05-21 ENCOUNTER — Telehealth: Payer: Self-pay

## 2016-05-21 NOTE — Telephone Encounter (Signed)
PATIENT WAS NO CALL NO SHOW FOR APPT

## 2016-05-21 NOTE — Telephone Encounter (Signed)
FYI- 05/21/16- This is the patient's 4th no show to our practice.  Per the notes on the appt, "per pt tcall 4/6 1:23pm advised to arrive 9:45am, stressed importance of keeping appt, advised of no show policy"..Marland Kitchen

## 2016-05-25 ENCOUNTER — Encounter: Payer: Self-pay | Admitting: Neurology

## 2016-08-19 IMAGING — DX DG ELBOW COMPLETE 3+V*L*
4 series · 4 of 4 positions shown · non-contrast
Comparison: None.

CLINICAL DATA: Left wrist pain and elbow pain. Tripped and fell on
driveway.

EXAM:
LEFT ELBOW - COMPLETE 3+ VIEW

[elbow ap]
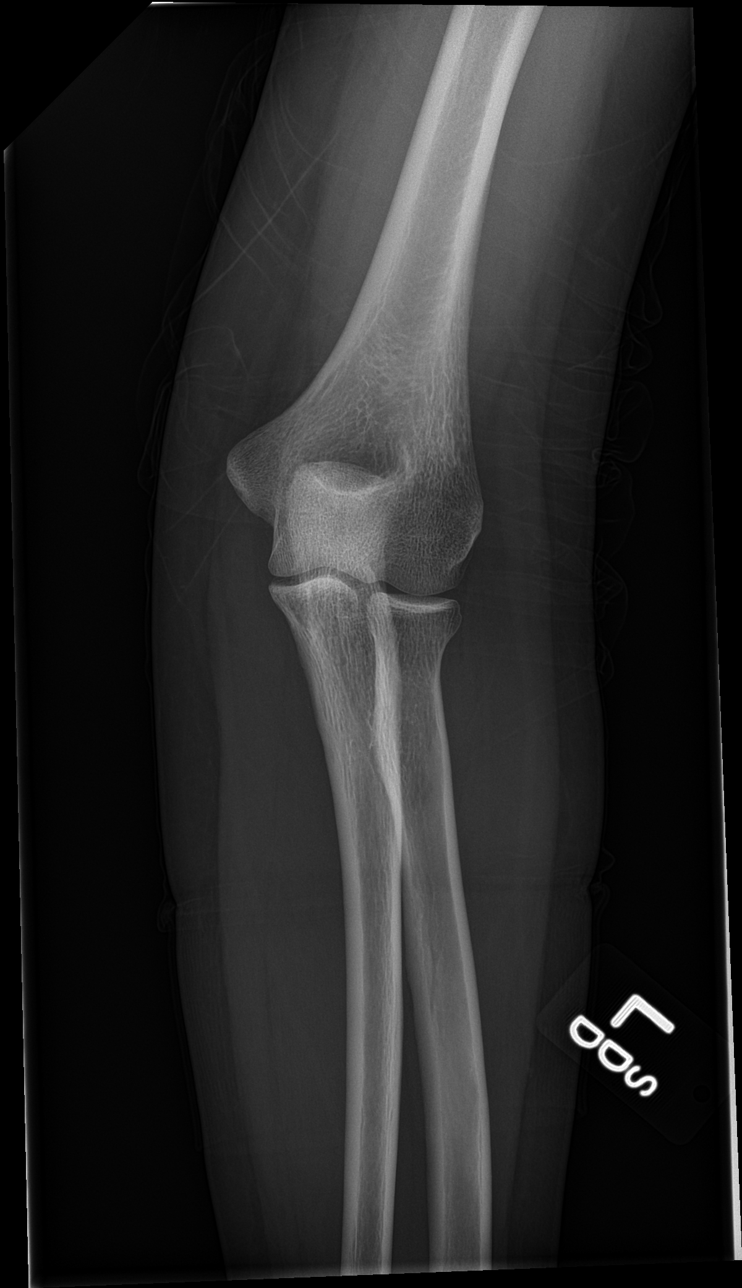

[elbow obl (1 of 2)]
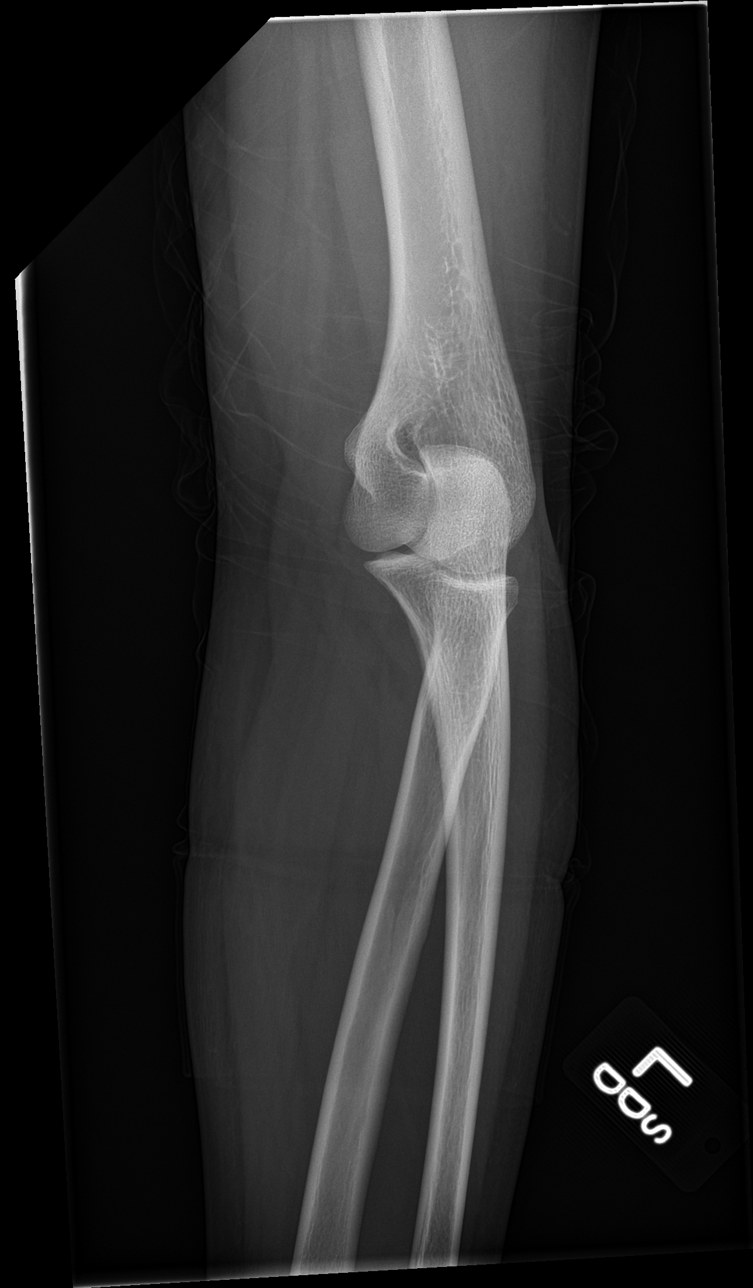

[elbow obl (2 of 2)]
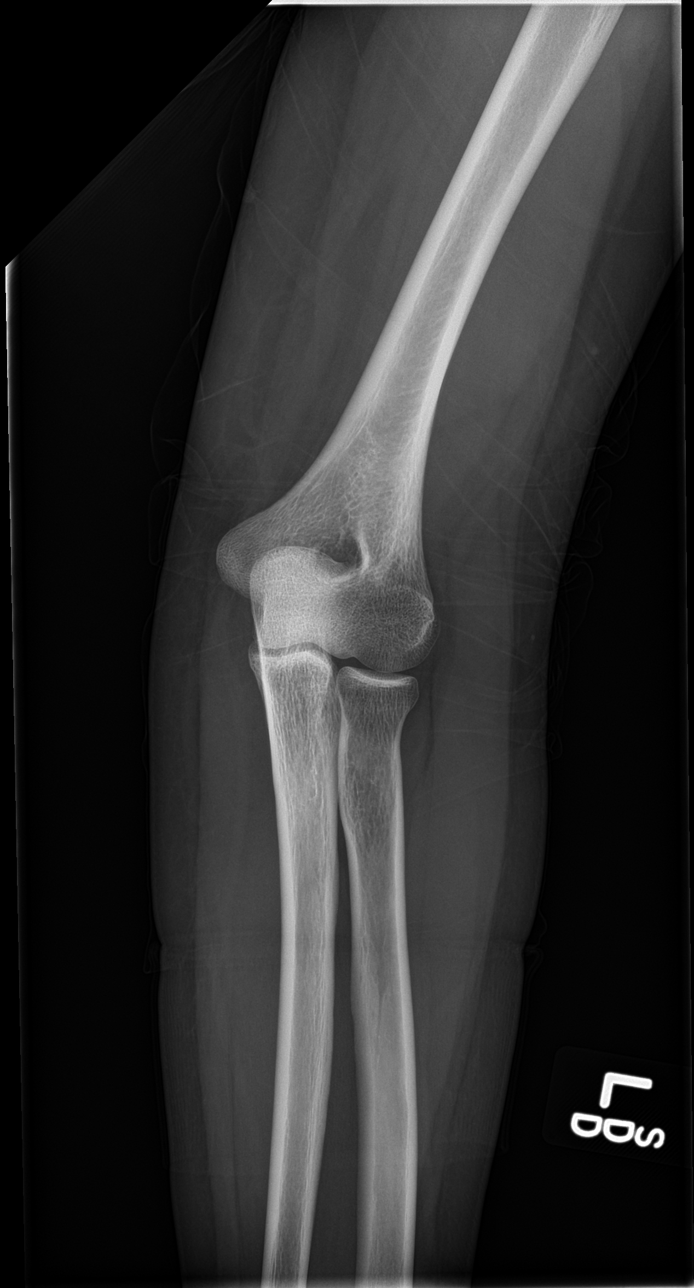

[elbow lat]
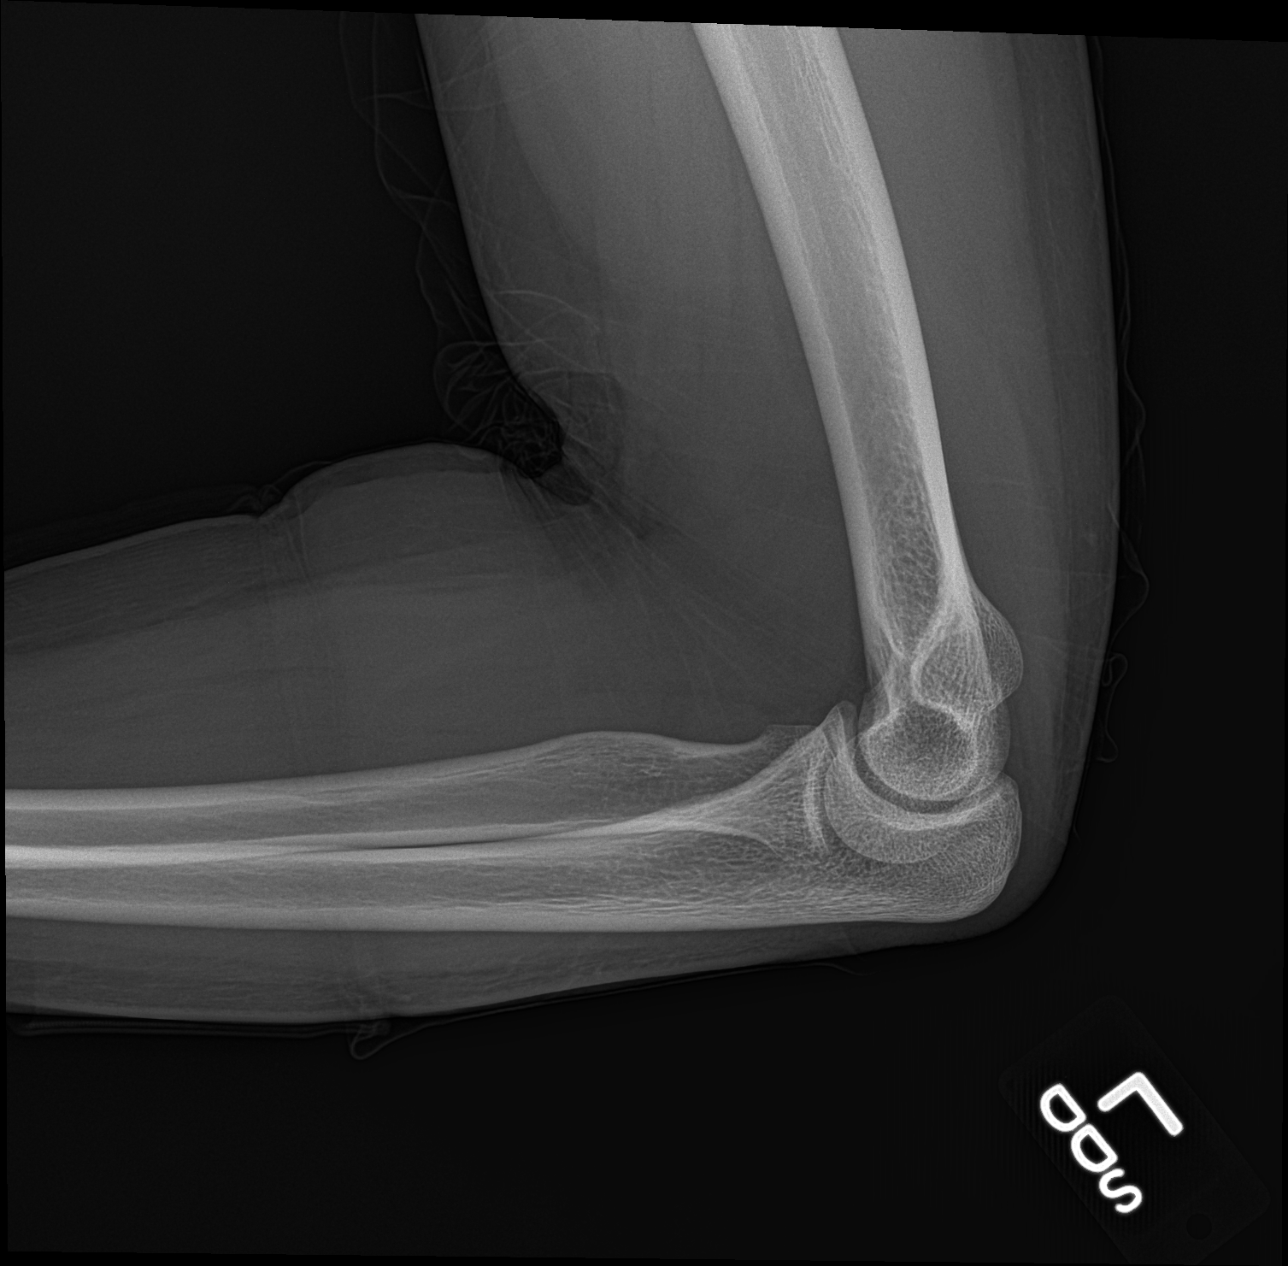

[4 of 4 positions shown; findings below may reference images not displayed]

FINDINGS: There is no evidence of fracture, dislocation, or joint effusion.
There is no evidence of arthropathy or other focal bone abnormality.
Soft tissues are unremarkable.
IMPRESSION: Negative.

## 2016-09-29 ENCOUNTER — Emergency Department (HOSPITAL_COMMUNITY): Payer: Medicaid Other

## 2016-09-29 ENCOUNTER — Emergency Department (HOSPITAL_COMMUNITY)
Admission: EM | Admit: 2016-09-29 | Discharge: 2016-09-29 | Disposition: A | Discharge status: Home / Self Care | Payer: Medicaid Other | Attending: Emergency Medicine | Admitting: Emergency Medicine

## 2016-09-29 ENCOUNTER — Encounter (HOSPITAL_COMMUNITY): Payer: Self-pay | Admitting: Emergency Medicine

## 2016-09-29 DIAGNOSIS — Z791 Long term (current) use of non-steroidal anti-inflammatories (NSAID): Secondary | ICD-10-CM | POA: Insufficient documentation

## 2016-09-29 DIAGNOSIS — R109 Unspecified abdominal pain: Secondary | ICD-10-CM | POA: Diagnosis not present

## 2016-09-29 DIAGNOSIS — Y999 Unspecified external cause status: Secondary | ICD-10-CM | POA: Diagnosis not present

## 2016-09-29 DIAGNOSIS — F909 Attention-deficit hyperactivity disorder, unspecified type: Secondary | ICD-10-CM | POA: Insufficient documentation

## 2016-09-29 DIAGNOSIS — W228XXA Striking against or struck by other objects, initial encounter: Secondary | ICD-10-CM | POA: Diagnosis not present

## 2016-09-29 DIAGNOSIS — Z79899 Other long term (current) drug therapy: Secondary | ICD-10-CM | POA: Insufficient documentation

## 2016-09-29 DIAGNOSIS — F17210 Nicotine dependence, cigarettes, uncomplicated: Secondary | ICD-10-CM | POA: Insufficient documentation

## 2016-09-29 DIAGNOSIS — S39012A Strain of muscle, fascia and tendon of lower back, initial encounter: Secondary | ICD-10-CM | POA: Insufficient documentation

## 2016-09-29 DIAGNOSIS — Y939 Activity, unspecified: Secondary | ICD-10-CM | POA: Diagnosis not present

## 2016-09-29 DIAGNOSIS — Y929 Unspecified place or not applicable: Secondary | ICD-10-CM | POA: Insufficient documentation

## 2016-09-29 DIAGNOSIS — S3992XA Unspecified injury of lower back, initial encounter: Secondary | ICD-10-CM | POA: Diagnosis present

## 2016-09-29 LAB — PREGNANCY, URINE: Preg Test, Ur: NEGATIVE

## 2016-09-29 LAB — URINALYSIS, ROUTINE W REFLEX MICROSCOPIC
Bilirubin Urine: NEGATIVE
GLUCOSE, UA: NEGATIVE mg/dL
Hgb urine dipstick: NEGATIVE
KETONES UR: NEGATIVE mg/dL
LEUKOCYTES UA: NEGATIVE
NITRITE: NEGATIVE
PH: 7 (ref 5.0–8.0)
PROTEIN: NEGATIVE mg/dL
Specific Gravity, Urine: 1.01 (ref 1.005–1.030)

## 2016-09-29 MED ORDER — CYCLOBENZAPRINE HCL 10 MG PO TABS: 10.0000 mg | ORAL_TABLET | Freq: Three times a day (TID) | ORAL | 0 refills | Status: DC | PRN

## 2016-09-29 MED ORDER — NAPROXEN 500 MG PO TABS: 500.0000 mg | ORAL_TABLET | Freq: Two times a day (BID) | ORAL | 0 refills | Status: DC

## 2016-09-29 NOTE — Discharge Instructions (Signed)
Follow-up with your doctor or return here for any worsening symptoms °

## 2016-09-29 NOTE — ED Triage Notes (Signed)
Pt c/o lower back pain and nausea x 1 week.

## 2016-09-29 NOTE — ED Provider Notes (Signed)
[facility]EM[facility]ENCY DEPT Provider Note   CSN: [id number] Arrival date & time: 09/29/16  1513     History   Chief Complaint Chief Complaint  Patient presents with  . Back Pain    HPI Corky Craftsoel Kapfer is a 25 y.o. female.  HPI   Corky Craftsoel Grob is a 25 y.o. female who presents to the Emergency Department complaining of low back pain for nearly two weeks.  She states that she accidentally struck her back against the edge of a table.  She reports pain with certain movements and when attempting to stand upright.  She reports minimal relief with NSAIDs.  She denies urine or bowel changes, numbness, weakness or pain to the LE's, abdominal pain, fever or chills.     Past Medical History:  Diagnosis Date  . ADHD (attention deficit hyperactivity disorder)   . Chronic bronchitis   . Chronic bronchitis (HCC)    2x a year. last episode 2.5 years ago  . Eczema   . Headache(784.0)    miagraine  . No pertinent past medical history   . Normal pregnancy, first 02/04/2012  . Ovarian cyst   . Pregnancy as incidental finding   . Shortness of breath   . SVD (spontaneous vaginal delivery) 02/05/2012    Patient Active Problem List   Diagnosis Date Noted  . Active labor at term 02/16/2015  . Migraine, unspecified, without mention of intractable migraine without mention of status migrainosus 08/15/2014  . 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 Abdominal pregnancy with intrauterine pregnancy 08/15/2014  . Chronic daily headache 08/15/2014  . SVD (spontaneous vaginal delivery) 02/05/2012    Past Surgical History:  Procedure Laterality Date  . TONSILLECTOMY    . TONSILLECTOMY    . WISDOM TOOTH EXTRACTION      OB History    Gravida Para Term Preterm AB Living   2 2 2  0 0 2   SAB TAB Ectopic Multiple Live Births   0 0 0 0 2       Home Medications    Prior to Admission medications   Medication Sig Start Date End Date Taking? Authorizing Provider  docusate sodium (COLACE) 100 MG capsule Take  1 capsule (100 mg total) by mouth every 12 (twelve) hours. 03/31/16   Lavera Guiseana Duo Liu, MD  ibuprofen (ADVIL,MOTRIN) 600 MG tablet Take 1 tablet (600 mg total) by mouth every 6 (six) hours as needed. 01/23/16   Burgess AmorJulie Idol, PA-C  naproxen (NAPROSYN) 500 MG tablet Take 1 tablet (500 mg total) by mouth 2 (two) times daily. 03/21/16   Burgess AmorJulie Idol, PA-C  nitrofurantoin, macrocrystal-monohydrate, (MACROBID) 100 MG capsule Take 1 capsule (100 mg total) by mouth 2 (two) times daily. 03/31/16   Lavera Guiseana Duo Liu, MD  polyethylene glycol powder (GLYCOLAX/MIRALAX) powder Take 17 g by mouth once. Dissolve 1 capful of powder into any liquid and drink once daily. If no good effect, take twice daily. 03/31/16   Lavera Guiseana Duo Liu, MD  QUEtiapine (SEROQUEL) 50 MG tablet Take 1 tablet (50 mg total) by mouth at bedtime. 01/05/16   Audery AmelJohn T Clapacs, MD  traMADol (ULTRAM) 50 MG tablet Take 1 tablet (50 mg total) by mouth every 6 (six) hours as needed. 03/21/16   Burgess AmorJulie Idol, PA-C  traZODone (DESYREL) 150 MG tablet Take 1 tablet (150 mg total) by mouth at bedtime. 01/05/16   Audery AmelJohn T Clapacs, MD    Family History Family History  Problem Relation Age of Onset  . Diabetes Mother   . Hyperlipidemia Mother   .  Chronic bronchitis Mother   . Endometriosis Mother   . Migraines Mother   . Miscarriages / [place] Mother   . Cancer Maternal Aunt     renal  . Cancer Maternal Uncle     breast  . Mental illness Maternal Grandmother     bipolar, paranoid schizophrenia  . COPD Maternal Grandmother   . Depression Maternal Grandmother   . Heart disease Maternal Grandmother   . Hypertension Maternal Grandmother   . Hyperthyroidism Maternal Grandmother   . Cancer Maternal Grandmother     liver  . Migraines Maternal Grandmother   . Miscarriages / Stillbirths Maternal Grandmother   . Heart disease Paternal Grandmother   . Heart disease Paternal Grandfather   . Heart attack Paternal Grandfather   . Stroke Paternal Grandfather   . Heart disease Father    . Obesity Father   . Hypertension Father     Social History Social History  Substance Use Topics  . Smoking status: Current Every Day Smoker    Packs/day: 0.50    Years: 1.00    Types: Cigarettes  . Smokeless tobacco: Never Used  . Alcohol use No     Allergies   Penicillins   Review of Systems Review of Systems  Constitutional: Negative for fever.  Respiratory: Negative for shortness of breath.   Gastrointestinal: Negative for abdominal pain, constipation and vomiting.  Genitourinary: Negative for decreased urine volume, difficulty urinating, dysuria, flank pain and hematuria.  Musculoskeletal: Positive for back pain. Negative for joint swelling.  Skin: Negative for rash.  Neurological: Negative for weakness and numbness.  All other systems reviewed and are negative.    Physical Exam Updated Vital Signs BP 136/82   Pulse 85   Temp 98.4 F (36.9 C) (Oral)   Resp 16   Ht 5\' 3"  (1.6 m)   Wt 70.9 kg   LMP 09/08/2016   SpO2 99%   BMI 27.69 kg/m   Physical Exam  Constitutional: She is oriented to person, place, and time. She appears well-developed and well-nourished. No distress.  HENT:  Head: Normocephalic and atraumatic.  Neck: Normal range of motion. Neck supple.  Cardiovascular: Normal rate, regular rhythm and intact distal pulses.   No murmur heard. Pulmonary/Chest: Effort normal and breath sounds normal. No respiratory distress.  Abdominal: Soft. She exhibits no distension. There is no tenderness.  Musculoskeletal: She exhibits tenderness. She exhibits no edema.       Lumbar back: She exhibits tenderness and pain. She exhibits normal range of motion, no swelling, no deformity, no laceration and normal pulse.  ttp of the mid lower lumbar spine and left paraspinal muscles.  DP pulses are brisk and symmetrical.  Distal sensation intact.  Pt has 5/5 strength against resistance of bilateral lower extremities.     Neurological: She is alert and oriented to  person, place, and time. She has normal strength. No sensory deficit. She exhibits normal muscle tone. Coordination and gait normal.  Reflex Scores:      Patellar reflexes are 2+ on the right side and 2+ on the left side.      Achilles reflexes are 2+ on the right side and 2+ on the left side. Skin: Skin is warm and dry. No rash noted.  Nursing note and vitals reviewed.    ED Treatments / Results  Labs (all labs ordered are listed, but only abnormal results are displayed) Labs Reviewed  URINALYSIS, ROUTINE W REFLEX MICROSCOPIC (NOT AT [facility])  PREGNANCY, URINE    EKG  EKG Interpretation None       Radiology Dg Lumbar Spine Complete  Result Date: 09/29/2016 CLINICAL DATA:  Low back and bilateral hip pain for the past week. No known injury. EXAM: LUMBAR SPINE - COMPLETE 4+ VIEW COMPARISON:  None. FINDINGS: There are 5 non rib-bearing lumbar type vertebral bodies. Normal alignment of lumbar spine. No anterolisthesis or retrolisthesis. No definite pars defects. Lumbar vertebral body heights are preserved. Intervertebral disc space heights are preserved. Limited visualization the bilateral SI joints and hips is normal. There are 2 adjacent punctate (approximately 4 mm) ill-defined opacities which overlie the expected location of the inferior pole of the left kidney. Regional bowel gas pattern and soft tissues are normal. IMPRESSION: 1. Normal lumbar spine radiographs. 2. Potential left-sided nephrolithiasis. Clinical correlation is advised. Further evaluation with noncontrast abdominal CT could be performed as indicated. Electronically Signed   By: Simonne Come M.D.   On: 09/29/2016 16:46   Ct Renal Stone Study  Result Date: 09/29/2016 CLINICAL DATA:  Bilateral flank pain for 2 weeks, possible left renal calculi on recent plain film initial encounter EXAM: CT ABDOMEN AND PELVIS WITHOUT CONTRAST TECHNIQUE: Multidetector CT imaging of the abdomen and pelvis was performed following the standard  protocol without IV contrast. COMPARISON:  09/29/2016 FINDINGS: Lower chest: No acute abnormality. Hepatobiliary: No focal liver abnormality is seen. No gallstones, gallbladder wall thickening, or biliary dilatation. Pancreas: Unremarkable. No pancreatic ductal dilatation or surrounding inflammatory changes. Spleen: Normal in size without focal abnormality. Adrenals/Urinary Tract: The adrenal glands are well visualized bilaterally. A single 4 mm stone is noted in the lower pole of the left kidney. This corresponds to that seen on prior plain film examination. No right renal calculi are seen. No obstructive changes or ureteral calculi are noted. The bladder is well distended. Stomach/Bowel: Stomach is within normal limits. Appendix appears normal. No evidence of bowel wall thickening, distention, or inflammatory changes. Vascular/Lymphatic: No significant vascular findings are present. No enlarged abdominal or pelvic lymph nodes. Reproductive: Uterus and bilateral adnexa are unremarkable. Other: No abdominal wall hernia or abnormality. No abdominopelvic ascites. Musculoskeletal: No acute or significant osseous findings. IMPRESSION: Nonobstructing left renal calculus as described. No other focal abnormality is noted. Electronically Signed   By: Alcide Clever M.D.   On: 09/29/2016 17:30    Procedures Procedures (including critical care time)  Medications Ordered in ED Medications - No data to display   Initial Impression / Assessment and Plan / ED Course  I have reviewed the triage vital signs and the nursing notes.  Pertinent labs & imaging results that were available during my care of the patient were reviewed by me and considered in my medical decision making (see chart for details).  Clinical Course   Pt with low back pain w/o focal neuro deficits.  No evidence of obstructing renal stone on imaging.  Pt ambulates with steady gait.  Agrees to symptomatic tx and close PMD f/u   Final Clinical  Impressions(s) / ED Diagnoses   Final diagnoses:  Strain of lumbar region, initial encounter    New Prescriptions New Prescriptions   No medications on file     Pauline Aus, Cordelia Poche 09/29/16 1748    Bethann Berkshire, MD 09/29/16 2330

## 2017-06-05 ENCOUNTER — Emergency Department (HOSPITAL_COMMUNITY)
Admission: EM | Admit: 2017-06-05 | Discharge: 2017-06-05 | Disposition: A | Discharge status: Home / Self Care | Payer: No Typology Code available for payment source | Attending: Emergency Medicine | Admitting: Emergency Medicine

## 2017-06-05 ENCOUNTER — Encounter (HOSPITAL_COMMUNITY): Payer: Self-pay | Admitting: Emergency Medicine

## 2017-06-05 DIAGNOSIS — M79661 Pain in right lower leg: Secondary | ICD-10-CM | POA: Diagnosis not present

## 2017-06-05 DIAGNOSIS — F909 Attention-deficit hyperactivity disorder, unspecified type: Secondary | ICD-10-CM | POA: Insufficient documentation

## 2017-06-05 DIAGNOSIS — M79662 Pain in left lower leg: Secondary | ICD-10-CM | POA: Diagnosis not present

## 2017-06-05 DIAGNOSIS — Y939 Activity, unspecified: Secondary | ICD-10-CM | POA: Diagnosis not present

## 2017-06-05 DIAGNOSIS — M549 Dorsalgia, unspecified: Secondary | ICD-10-CM | POA: Diagnosis not present

## 2017-06-05 DIAGNOSIS — Z5321 Procedure and treatment not carried out due to patient leaving prior to being seen by health care provider: Secondary | ICD-10-CM | POA: Insufficient documentation

## 2017-06-05 DIAGNOSIS — F17210 Nicotine dependence, cigarettes, uncomplicated: Secondary | ICD-10-CM | POA: Insufficient documentation

## 2017-06-05 DIAGNOSIS — S8992XA Unspecified injury of left lower leg, initial encounter: Secondary | ICD-10-CM | POA: Diagnosis present

## 2017-06-05 DIAGNOSIS — Y92410 Unspecified street and highway as the place of occurrence of the external cause: Secondary | ICD-10-CM | POA: Insufficient documentation

## 2017-06-05 DIAGNOSIS — Y999 Unspecified external cause status: Secondary | ICD-10-CM | POA: Diagnosis not present

## 2017-06-05 NOTE — ED Notes (Signed)
Advised pt and family that this RN would be going to clean her a room and that is should be just a few more minutes before I would be taking her to that room.

## 2017-06-05 NOTE — ED Notes (Signed)
Family updated on plan of care, comfort measures provided,
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
## 2017-06-05 NOTE — ED Notes (Signed)
Came back to triage to take pt to room, pt's father was returning wheelchair to waiting room and advised that pt did not want to stay any longer, RN advised to father that pt had a room ready but father states " she did not want to stay",

## 2018-06-11 ENCOUNTER — Encounter (HOSPITAL_COMMUNITY): Payer: Self-pay | Admitting: Emergency Medicine

## 2018-06-11 ENCOUNTER — Other Ambulatory Visit: Payer: Self-pay

## 2018-06-11 ENCOUNTER — Emergency Department (HOSPITAL_COMMUNITY)
Admission: EM | Admit: 2018-06-11 | Discharge: 2018-06-11 | Disposition: A | Discharge status: Home / Self Care | Payer: Medicaid Other | Attending: Emergency Medicine | Admitting: Emergency Medicine

## 2018-06-11 DIAGNOSIS — K0889 Other specified disorders of teeth and supporting structures: Secondary | ICD-10-CM | POA: Insufficient documentation

## 2018-06-11 DIAGNOSIS — F17210 Nicotine dependence, cigarettes, uncomplicated: Secondary | ICD-10-CM | POA: Insufficient documentation

## 2018-06-11 MED ORDER — CLINDAMYCIN HCL 150 MG PO CAPS: 300.0000 mg | ORAL_CAPSULE | Freq: Three times a day (TID) | ORAL | 0 refills | Status: DC

## 2018-06-11 NOTE — ED Triage Notes (Signed)
Pt states she has had pain in her tooth on the right lower side. Was scheduled for root canal but never went. Pt states her tooth is "split"  Hurts to swallow anything and with heat/cold.

## 2018-06-11 NOTE — ED Provider Notes (Signed)
[facility] Provider Note   CSN: [id number] Arrival date & time: 06/11/18  0111     History   Chief Complaint Chief Complaint  Patient presents with  . Dental Pain    HPI Robin [name] is a 27 y.o. female.  Patient presents to the emergency department with a chief complaint of dental pain.  She states that she has had pain for the past 3 days.  She states that she was scheduled to have a root canal, but never went.  She no longer has a Education officer, communitydentist.  She denies any fever.  She states that her pain is worsened with chewing.  She also has sensitivity to heat and cold.  She denies any other associated symptoms.  The history is provided by the patient. No language interpreter was used.    Past Medical History:  Diagnosis Date  . ADHD (attention deficit hyperactivity disorder)   . Chronic bronchitis   . Chronic bronchitis (HCC)    2x a year. last episode 2.5 years ago  . Eczema   . Headache(784.0)    miagraine  . No pertinent past medical history   . Normal pregnancy, first 02/04/2012  . Ovarian cyst   . Pregnancy as incidental finding   . Shortness of breath   . SVD (spontaneous vaginal delivery) 02/05/2012    Patient Active Problem List   Diagnosis Date Noted  . Active labor at term 02/16/2015  . Migraine, unspecified, without mention of intractable migraine without mention of status migrainosus 08/15/2014  . 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 Abdominal pregnancy with intrauterine pregnancy 08/15/2014  . Chronic daily headache 08/15/2014  . SVD (spontaneous vaginal delivery) 02/05/2012    Past Surgical History:  Procedure Laterality Date  . TONSILLECTOMY    . TONSILLECTOMY    . WISDOM TOOTH EXTRACTION       OB History    Gravida  2   Para  2   Term  2   Preterm  0   AB  0   Living  2     SAB  0   TAB  0   Ectopic  0   Multiple  0   Live Births  2            Home Medications    Prior to Admission  medications   Medication Sig Start Date End Date Taking? Authorizing Provider  clindamycin (CLEOCIN) 150 MG capsule Take 2 capsules (300 mg total) by mouth 3 (three) times daily. May dispense as 150mg  capsules 06/11/18   Roxy HorsemanBrowning, Javyn Havlin, PA-C  cyclobenzaprine (FLEXERIL) 10 MG tablet Take 1 tablet (10 mg total) by mouth 3 (three) times daily as needed. 09/29/16   Triplett, Tammy, PA-C  docusate sodium (COLACE) 100 MG capsule Take 1 capsule (100 mg total) by mouth every 12 (twelve) hours. 03/31/16   Lavera GuiseLiu, Dana Duo, MD  ibuprofen (ADVIL,MOTRIN) 600 MG tablet Take 1 tablet (600 mg total) by mouth every 6 (six) hours as needed. 01/23/16   Burgess AmorIdol, Julie, PA-C  naproxen (NAPROSYN) 500 MG tablet Take 1 tablet (500 mg total) by mouth 2 (two) times daily with a meal. 09/29/16   Triplett, Tammy, PA-C  nitrofurantoin, macrocrystal-monohydrate, (MACROBID) 100 MG capsule Take 1 capsule (100 mg total) by mouth 2 (two) times daily. 03/31/16   Lavera GuiseLiu, Dana Duo, MD  polyethylene glycol powder (GLYCOLAX/MIRALAX) powder Take 17 g by mouth once. Dissolve 1 capful of powder into any liquid and drink once daily. If  no good effect, take twice daily. 03/31/16   Lavera Guise, MD  QUEtiapine (SEROQUEL) 50 MG tablet Take 1 tablet (50 mg total) by mouth at bedtime. 01/05/16   Clapacs, Jackquline Denmark, MD  traMADol (ULTRAM) 50 MG tablet Take 1 tablet (50 mg total) by mouth every 6 (six) hours as needed. 03/21/16   Burgess Amor, PA-C  traZODone (DESYREL) 150 MG tablet Take 1 tablet (150 mg total) by mouth at bedtime. 01/05/16   Clapacs, Jackquline Denmark, MD    Family History Family History  Problem Relation Age of Onset  . Diabetes Mother   . Hyperlipidemia Mother   . Chronic bronchitis Mother   . Endometriosis Mother   . Migraines Mother   . Miscarriages / [place] Mother   . Cancer Maternal Aunt        renal  . Cancer Maternal Uncle        breast  . Mental illness Maternal Grandmother        bipolar, paranoid schizophrenia  . COPD Maternal Grandmother    . Depression Maternal Grandmother   . Heart disease Maternal Grandmother   . Hypertension Maternal Grandmother   . Hyperthyroidism Maternal Grandmother   . Cancer Maternal Grandmother        liver  . Migraines Maternal Grandmother   . Miscarriages / Stillbirths Maternal Grandmother   . Heart disease Paternal Grandmother   . Heart disease Paternal Grandfather   . Heart attack Paternal Grandfather   . Stroke Paternal Grandfather   . Heart disease Father   . Obesity Father   . Hypertension Father     Social History Social History   Tobacco Use  . Smoking status: Current Every Day Smoker    Packs/day: 0.50    Years: 1.00    Pack years: 0.50    Types: Cigarettes  . Smokeless tobacco: Never Used  Substance Use Topics  . Alcohol use: No  . Drug use: Yes    Types: Marijuana     Allergies   Penicillins   Review of Systems Review of Systems  All other systems reviewed and are negative.    Physical Exam Updated Vital Signs BP 116/70 (BP Location: Right Arm)   Pulse 82   Temp 98.4 F (36.9 C) (Oral)   Resp 18   Ht 5\' 3"  (1.6 m)   Wt 58.1 kg (128 lb)   SpO2 100%   BMI 22.67 kg/m   Physical Exam Physical Exam  Constitutional: Pt appears well-developed and well-nourished.  HENT:  Head: Normocephalic.  Right Ear: Tympanic membrane, external ear and ear canal normal.  Left Ear: Tympanic membrane, external ear and ear canal normal.  Nose: Nose normal. Right sinus exhibits no maxillary sinus tenderness and no frontal sinus tenderness. Left sinus exhibits no maxillary sinus tenderness and no frontal sinus tenderness.  Mouth/Throat: Uvula is midline, oropharynx is clear and moist and mucous membranes are normal. No oral lesions. No uvula swelling or lacerations. No oropharyngeal exudate, posterior oropharyngeal edema, posterior oropharyngeal erythema or tonsillar abscesses.  Poor dentition No gingival swelling, fluctuance or induration No gross abscess  No  sublingual edema, tenderness to palpation, or sign of Ludwig's angina, or deep space infection Pain at right lower rear molar, cracked Eyes: Conjunctivae are normal. Pupils are equal, round, and reactive to light. Right eye exhibits no discharge. Left eye exhibits no discharge.  Neck: Normal range of motion. Neck supple.  No stridor Handling secretions without difficulty No nuchal rigidity No cervical lymphadenopathy Cardiovascular:  Normal rate, regular rhythm and normal heart sounds.   Pulmonary/Chest: Effort normal. No respiratory distress.  Equal chest rise  Abdominal: Soft. Bowel sounds are normal. Pt exhibits no distension. There is no tenderness.  Lymphadenopathy: Pt has no cervical adenopathy.  Neurological: Pt is alert and oriented x 4  Skin: Skin is warm and dry.  Psychiatric: Pt has a normal mood and affect.  Nursing note and vitals reviewed.    ED Treatments / Results  Labs (all labs ordered are listed, but only abnormal results are displayed) Labs Reviewed - No data to display  EKG None  Radiology No results found.  Procedures Dental Block Date/Time: 06/11/2018 2:18 AM Performed by: Roxy Horseman, PA-C Authorized by: Roxy Horseman, PA-C   Consent:    Consent obtained:  Verbal   Consent given by:  Patient   Risks discussed:  Unsuccessful block, pain and infection   Alternatives discussed:  No treatment Indications:    Indications: dental pain   Location:    Block type:  Inferior alveolar   Laterality:  Right Procedure details (see MAR for exact dosages):    Topical anesthetic:  Benzocaine gel   Syringe type:  Controlled syringe   Needle gauge:  27 G   Anesthetic injected:  Bupivacaine 0.25% WITH epi   Injection procedure:  Anatomic landmarks identified, introduced needle, incremental injection and anatomic landmarks palpated Post-procedure details:    Outcome:  Pain improved   Patient tolerance of procedure:  Tolerated well, no immediate  complications   (including critical care time)  Medications Ordered in ED Medications - No data to display   Initial Impression / Assessment and Plan / ED Course  I have reviewed the triage vital signs and the nursing notes.  Pertinent labs & imaging results that were available during my care of the patient were reviewed by me and considered in my medical decision making (see chart for details).     Patient with dentalgia.  No abscess requiring immediate incision and drainage.  Exam not concerning for Ludwig's angina or pharyngeal abscess.  Will treat with clinda. Pt instructed to follow-up with dentist.  Discussed return precautions. Pt safe for discharge.   Final Clinical Impressions(s) / ED Diagnoses   Final diagnoses:  Pain, dental    ED Discharge Orders        Ordered    clindamycin (CLEOCIN) 150 MG capsule  3 times daily     06/11/18 0216       Roxy Horseman, PA-C 06/11/18 1610    Mesner, Barbara Cower, MD 06/11/18 2304

## 2018-06-11 NOTE — Discharge Instructions (Signed)
Please make an appointment with a dentist.  Take antibiotics as prescribed.

## 2018-06-12 ENCOUNTER — Telehealth: Payer: Self-pay | Admitting: Neurology

## 2018-06-12 NOTE — Telephone Encounter (Signed)
Phone rep called pt, LVM on the number provider she will need a referral.

## 2018-06-12 NOTE — Telephone Encounter (Signed)
Pt called to schedule an appt for HA's. She is aware of the 4 no shows she's had, phone rep advised an appt could not be made at this time as the provider will need to approve scheduling another appt.

## 2018-06-12 NOTE — Telephone Encounter (Signed)
Pt was last seen 07/2014. Pt will need to seek her PCP, and be assess if a referral is needed for headaches. This pt will be consider new to [facility].

## 2018-10-16 ENCOUNTER — Other Ambulatory Visit: Payer: Self-pay

## 2018-10-16 DIAGNOSIS — R112 Nausea with vomiting, unspecified: Secondary | ICD-10-CM | POA: Diagnosis not present

## 2018-10-16 DIAGNOSIS — F17210 Nicotine dependence, cigarettes, uncomplicated: Secondary | ICD-10-CM | POA: Diagnosis not present

## 2018-10-16 DIAGNOSIS — F909 Attention-deficit hyperactivity disorder, unspecified type: Secondary | ICD-10-CM | POA: Insufficient documentation

## 2018-10-16 DIAGNOSIS — R1110 Vomiting, unspecified: Secondary | ICD-10-CM | POA: Diagnosis not present

## 2018-10-16 DIAGNOSIS — R1010 Upper abdominal pain, unspecified: Secondary | ICD-10-CM | POA: Insufficient documentation

## 2018-10-16 DIAGNOSIS — R197 Diarrhea, unspecified: Secondary | ICD-10-CM | POA: Diagnosis not present

## 2018-10-16 DIAGNOSIS — R109 Unspecified abdominal pain: Secondary | ICD-10-CM | POA: Diagnosis present

## 2018-10-16 DIAGNOSIS — Z79899 Other long term (current) drug therapy: Secondary | ICD-10-CM | POA: Insufficient documentation

## 2018-10-16 LAB — URINALYSIS, COMPLETE (UACMP) WITH MICROSCOPIC
BILIRUBIN URINE: NEGATIVE
Bacteria, UA: NONE SEEN
GLUCOSE, UA: NEGATIVE mg/dL
HGB URINE DIPSTICK: NEGATIVE
Ketones, ur: NEGATIVE mg/dL
NITRITE: NEGATIVE
PH: 7 (ref 5.0–8.0)
Protein, ur: NEGATIVE mg/dL
Specific Gravity, Urine: 1.01 (ref 1.005–1.030)

## 2018-10-16 LAB — CBC
HEMATOCRIT: 35.8 % — AB (ref 36.0–46.0)
Hemoglobin: 11.9 g/dL — ABNORMAL LOW (ref 12.0–15.0)
MCH: 29.8 pg (ref 26.0–34.0)
MCHC: 33.2 g/dL (ref 30.0–36.0)
MCV: 89.5 fL (ref 80.0–100.0)
NRBC: 0 % (ref 0.0–0.2)
PLATELETS: 330 10*3/uL (ref 150–400)
RBC: 4 MIL/uL (ref 3.87–5.11)
RDW: 14 % (ref 11.5–15.5)
WBC: 8.1 10*3/uL (ref 4.0–10.5)

## 2018-10-16 NOTE — ED Triage Notes (Signed)
Pt in with co mid abd pain states has had vomiting and diarrhea. States symptoms started 2 months ago, pain is in lower back at times.

## 2018-10-17 ENCOUNTER — Emergency Department
Admission: EM | Admit: 2018-10-17 | Discharge: 2018-10-17 | Disposition: A | Discharge status: Home / Self Care | Payer: Medicaid Other | Attending: Emergency Medicine | Admitting: Emergency Medicine

## 2018-10-17 ENCOUNTER — Encounter: Payer: Self-pay | Admitting: Radiology

## 2018-10-17 ENCOUNTER — Emergency Department: Payer: Medicaid Other

## 2018-10-17 DIAGNOSIS — R1010 Upper abdominal pain, unspecified: Secondary | ICD-10-CM

## 2018-10-17 DIAGNOSIS — R1110 Vomiting, unspecified: Secondary | ICD-10-CM | POA: Diagnosis not present

## 2018-10-17 LAB — LIPASE, BLOOD: Lipase: 33 U/L (ref 11–51)

## 2018-10-17 LAB — COMPREHENSIVE METABOLIC PANEL
ALBUMIN: 4.3 g/dL (ref 3.5–5.0)
ALT: 17 U/L (ref 0–44)
ANION GAP: 6 (ref 5–15)
AST: 18 U/L (ref 15–41)
Alkaline Phosphatase: 58 U/L (ref 38–126)
BUN: 12 mg/dL (ref 6–20)
CHLORIDE: 107 mmol/L (ref 98–111)
CO2: 24 mmol/L (ref 22–32)
Calcium: 9 mg/dL (ref 8.9–10.3)
Creatinine, Ser: 0.7 mg/dL (ref 0.44–1.00)
GFR calc Af Amer: >60 mL/min (ref >60)
GFR calc non Af Amer: >60 mL/min (ref >60)
GLUCOSE: 89 mg/dL (ref 70–99)
POTASSIUM: 3.9 mmol/L (ref 3.5–5.1)
SODIUM: 137 mmol/L (ref 135–145)
TOTAL PROTEIN: 7.3 g/dL (ref 6.5–8.1)
Total Bilirubin: 0.4 mg/dL (ref 0.3–1.2)

## 2018-10-17 LAB — POCT PREGNANCY, URINE: Preg Test, Ur: NEGATIVE

## 2018-10-17 MED ORDER — IOHEXOL 300 MG/ML  SOLN
75.0000 mL | Freq: Once | INTRAMUSCULAR | Status: AC | PRN
  Administered 2018-10-17: 75 mL via INTRAVENOUS

## 2018-10-17 NOTE — ED Provider Notes (Signed)
[facility] Emergency Department Provider Note  ____________________________________________   None    (approximate)  I have reviewed the triage vital signs and the nursing notes.   HISTORY  Chief Complaint Abdominal Pain   HPI Robin [name] is a 27 y.o. female who presents to the emergency department for treatment and evaluation of abdominal pain x 2 months. She has had vomiting and diarrhea intermittently as well. Pain does not change with eating. She denies fever. No alleviating measures have been attempted prior to arrival.   Past Medical History:  Diagnosis Date  . ADHD (attention deficit hyperactivity disorder)   . Chronic bronchitis   . Chronic bronchitis (HCC)    2x a year. last episode 2.5 years ago  . Eczema   . Headache(784.0)    miagraine  . No pertinent past medical history   . Normal pregnancy, first 02/04/2012  . Ovarian cyst   . Pregnancy as incidental finding   . Shortness of breath   . SVD (spontaneous vaginal delivery) 02/05/2012    Patient Active Problem List   Diagnosis Date Noted  . Active labor at term 02/16/2015  . Migraine, unspecified, without mention of intractable migraine without mention of status migrainosus 08/15/2014  . 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 Abdominal pregnancy with intrauterine pregnancy 08/15/2014  . Chronic daily headache 08/15/2014  . SVD (spontaneous vaginal delivery) 02/05/2012    Past Surgical History:  Procedure Laterality Date  . TONSILLECTOMY    . TONSILLECTOMY    . WISDOM TOOTH EXTRACTION      Prior to Admission medications   Medication Sig Start Date End Date Taking? Authorizing Provider  clindamycin (CLEOCIN) 150 MG capsule Take 2 capsules (300 mg total) by mouth 3 (three) times daily. May dispense as 150mg  capsules 06/11/18   Roxy Horseman, PA-C  cyclobenzaprine (FLEXERIL) 10 MG tablet Take 1 tablet (10 mg total) by mouth 3 (three) times daily as needed. 09/29/16   Kora Groom,  Tammy, PA-C  docusate sodium (COLACE) 100 MG capsule Take 1 capsule (100 mg total) by mouth every 12 (twelve) hours. 03/31/16   Lavera Guise, MD  ibuprofen (ADVIL,MOTRIN) 600 MG tablet Take 1 tablet (600 mg total) by mouth every 6 (six) hours as needed. 01/23/16   Burgess Amor, PA-C  naproxen (NAPROSYN) 500 MG tablet Take 1 tablet (500 mg total) by mouth 2 (two) times daily with a meal. 09/29/16   Catlyn Shipton, Tammy, PA-C  nitrofurantoin, macrocrystal-monohydrate, (MACROBID) 100 MG capsule Take 1 capsule (100 mg total) by mouth 2 (two) times daily. 03/31/16   Lavera Guise, MD  polyethylene glycol powder (GLYCOLAX/MIRALAX) powder Take 17 g by mouth once. Dissolve 1 capful of powder into any liquid and drink once daily. If no good effect, take twice daily. 03/31/16   Lavera Guise, MD  QUEtiapine (SEROQUEL) 50 MG tablet Take 1 tablet (50 mg total) by mouth at bedtime. 01/05/16   Clapacs, Jackquline Denmark, MD  traMADol (ULTRAM) 50 MG tablet Take 1 tablet (50 mg total) by mouth every 6 (six) hours as needed. 03/21/16   Burgess Amor, PA-C  traZODone (DESYREL) 150 MG tablet Take 1 tablet (150 mg total) by mouth at bedtime. 01/05/16   Clapacs, Jackquline Denmark, MD    Allergies Penicillins  Family History  Problem Relation Age of Onset  . Diabetes Mother   . Hyperlipidemia Mother   . Chronic bronchitis Mother   . Endometriosis Mother   . Migraines Mother   . Miscarriages / [place]  Mother   . Cancer Maternal Aunt        renal  . Cancer Maternal Uncle        breast  . Mental illness Maternal Grandmother        bipolar, paranoid schizophrenia  . COPD Maternal Grandmother   . Depression Maternal Grandmother   . Heart disease Maternal Grandmother   . Hypertension Maternal Grandmother   . Hyperthyroidism Maternal Grandmother   . Cancer Maternal Grandmother        liver  . Migraines Maternal Grandmother   . Miscarriages / Stillbirths Maternal Grandmother   . Heart disease Paternal Grandmother   . Heart disease Paternal  Grandfather   . Heart attack Paternal Grandfather   . Stroke Paternal Grandfather   . Heart disease Father   . Obesity Father   . Hypertension Father     Social History Social History   Tobacco Use  . Smoking status: Current Every Day Smoker    Packs/day: 0.50    Years: 1.00    Pack years: 0.50    Types: Cigarettes  . Smokeless tobacco: Never Used  Substance Use Topics  . Alcohol use: No  . Drug use: Yes    Types: Marijuana    Review of Systems  Constitutional: No fever/chills Eyes: No visual changes. ENT: No sore throat. Cardiovascular: Denies chest pain. Respiratory: Denies shortness of breath. Gastrointestinal: Positive for abdominal pain.  Positive for nausea vomiting.  Positive for diarrhea. Genitourinary: Negative for dysuria. Musculoskeletal: Negative for back pain. Skin: Negative for rash. Neurological: Negative for headaches, focal weakness or numbness. ___________________________________________   PHYSICAL EXAM:  VITAL SIGNS: ED Triage Vitals  Enc Vitals Group     BP 10/16/18 2325 125/75     Pulse Rate 10/16/18 2325 (!) 107     Resp 10/16/18 2325 20     Temp 10/16/18 2325 98.8 F (37.1 C)     Temp Source 10/16/18 2325 Oral     SpO2 10/16/18 2325 100 %     Weight 10/16/18 2326 128 lb (58.1 kg)     Height --      Head Circumference --      Peak Flow --      Pain Score 10/16/18 2326 7     Pain Loc --      Pain Edu? --      Excl. in GC? --     Constitutional: Alert and oriented. Well appearing and in no acute distress. Eyes: Conjunctivae are normal.  Head: Atraumatic. Nose: No congestion/rhinnorhea. Mouth/Throat: Mucous membranes are moist.  Oropharynx non-erythematous. Neck: No stridor.   Cardiovascular: Normal rate, regular rhythm. Grossly normal heart sounds.  Good peripheral circulation. Respiratory: Normal respiratory effort.  No retractions. Lungs CTAB. Gastrointestinal: Soft and nontender. No distention. No abdominal bruits.  Bowel  sounds present and active x4 quadrants. Musculoskeletal: No lower extremity tenderness nor edema.  No joint effusions. Neurologic:  Normal speech and language. No gross focal neurologic deficits are appreciated. No gait instability. Skin:  Skin is warm, dry and intact. No rash noted. Psychiatric: Mood and affect are normal. Speech and behavior are normal.  ____________________________________________   LABS (all labs ordered are listed, but only abnormal results are displayed)  Labs Reviewed  CBC - Abnormal; Notable for the following components:      Result Value   Hemoglobin 11.9 (*)    HCT 35.8 (*)    All other components within normal limits  URINALYSIS, COMPLETE (UACMP) WITH MICROSCOPIC - Abnormal;  Notable for the following components:   Color, Urine YELLOW (*)    APPearance CLEAR (*)    Leukocytes, UA TRACE (*)    All other components within normal limits  COMPREHENSIVE METABOLIC PANEL  LIPASE, BLOOD  POC URINE PREG, ED  POCT PREGNANCY, URINE   ____________________________________________  EKG  Not indicated ____________________________________________  RADIOLOGY  ED MD interpretation: Pending  Official radiology report(s): Ct Abdomen Pelvis W Contrast  Result Date: 10/17/2018 CLINICAL DATA:  Mid abdominal pain, vomiting EXAM: CT ABDOMEN AND PELVIS WITH CONTRAST TECHNIQUE: Multidetector CT imaging of the abdomen and pelvis was performed using the standard protocol following bolus administration of intravenous contrast. CONTRAST:  75mL OMNIPAQUE IOHEXOL 300 MG/ML  SOLN COMPARISON:  09/29/2016 FINDINGS: Lower chest: Lung bases are clear. No effusions. Heart is normal size. Hepatobiliary: No focal hepatic abnormality. Gallbladder unremarkable. Pancreas: No focal abnormality or ductal dilatation. Spleen: No focal abnormality.  Normal size. Adrenals/Urinary Tract: Small nonobstructing stone in the mid to lower pole of the left kidney, stable. No ureteral stones or  hydronephrosis. Urinary bladder and adrenal glands unremarkable. Stomach/Bowel: Moderate stool burden throughout the colon. Normal appendix. Stomach and small bowel grossly unremarkable. Vascular/Lymphatic: No evidence of aneurysm or adenopathy. Reproductive: Uterus and adnexa unremarkable.  No mass. Other: Trace free fluid in the pelvis.  No free air. Musculoskeletal: No acute bony abnormality. IMPRESSION: Moderate stool burden throughout the colon. No acute findings in the abdomen or pelvis. Small nonobstructing left lower pole renal stone, stable. Normal appendix. Electronically Signed   By: Charlett Nose M.D.   On: 10/17/2018 02:44    ____________________________________________   PROCEDURES  Procedure(s) performed: None  Procedures  Critical Care performed: No  ____________________________________________   INITIAL IMPRESSION / ASSESSMENT AND PLAN / ED COURSE  As part of my medical decision making, I reviewed the following data within the electronic MEDICAL RECORD NUMBER    27 year old female presenting to the emergency department for treatment and evaluation of abdominal pain.  Exam is reassuring.  CT of the abdomen and pelvis is pending.  Care relinquished to Dr. York Cerise who will review the CT scan and set disposition.  Clinical Course as of Oct 17 304  Tue Oct 17, 2018  0026 Potassium: 3.9 [CT]    Clinical Course User Index [CT] Kem Boroughs B, FNP     ____________________________________________   FINAL CLINICAL IMPRESSION(S) / ED DIAGNOSES  Final diagnoses:  Pain of upper abdomen     ED Discharge Orders    None       Note:  This document was prepared using Dragon voice recognition software and may include unintentional dictation errors.    Chinita Pester, FNP 10/17/18 1610    Loleta Rose, MD 10/17/18 [phone]

## 2018-10-17 NOTE — ED Notes (Signed)
Pt again required numerous attempts to be awakened prior to responding; IV initiated in prep for CT

## 2018-10-17 NOTE — ED Provider Notes (Signed)
-----------------------------------------   2:01 AM on 10/17/2018 -----------------------------------------   Assuming care from [facility].  In short, Robin [name] is a 27 y.o. female with a chief complaint of abdominal pain for months.  Refer to the original H&P for additional details.  The current plan of care is to follow up CT scan.   ----------------------------------------- 4:22 AM on 10/17/2018 -----------------------------------------  The patient has been sleeping solidly 4 hours.  I woke her up and she says that she feels better.  CT scan was unremarkable except for a moderate stool burden.  I will give her some information about constipation but I do think that this may explain her intermittent symptoms.  She says that she has not had any diarrhea for about a week.  She has no pelvic pain and no pelvic or vaginal complaints.  She is comfortable with the plan for discharge and outpatient follow-up.  I gave my usual and customary return precautions.    Loleta Rose, MD 10/17/18 [phone]

## 2018-10-17 NOTE — ED Notes (Addendum)
Pt uprite on stretcher in exam room sleeping soundly; pt attempted to be awakened numerous times by this nurse before finally responding; pt remains with eyes closed and st she is unaware that the provider has been in to see her; pt oriented to plan of care; resp even/unlab, lungs clear, apical audible & regular, +BS, abd soft/nondist/nontender

## 2018-10-17 NOTE — ED Notes (Signed)
Pt to CT via stretcher accomp by CT tech 

## 2018-10-17 NOTE — Discharge Instructions (Signed)

## 2018-10-23 DIAGNOSIS — F332 Major depressive disorder, recurrent severe without psychotic features: Secondary | ICD-10-CM | POA: Diagnosis not present

## 2018-10-24 DIAGNOSIS — F332 Major depressive disorder, recurrent severe without psychotic features: Secondary | ICD-10-CM | POA: Diagnosis not present

## 2018-12-06 DIAGNOSIS — Z3200 Encounter for pregnancy test, result unknown: Secondary | ICD-10-CM | POA: Diagnosis not present

## 2018-12-06 DIAGNOSIS — Z3009 Encounter for other general counseling and advice on contraception: Secondary | ICD-10-CM | POA: Diagnosis not present

## 2018-12-27 NOTE — L&D Delivery Note (Signed)
Delivery Note Pt rapidly progressed to C/C/+1-3, pushed with several contractions for delivery.  At 8:54 PM a viable and healthy female was delivered via Vaginal, Spontaneous (Presentation: OA; LOT ).  APGAR: 9,9; weight P .   Placenta status: delivered, intact .  Cord: 3V with the following complications: nuchal x 1.    Anesthesia:  epidural Episiotomy:  none Lacerations: 2nd degree Suture Repair: 3.0 vicryl rapide Est. Blood Loss (mL): 135cc  Mom to postpartum.  Baby to Couplet care / Skin to Skin.  Robin [name] 08/01/2019, 9:14 PM  Bo/RI/O+/Contra ?/no Tdap in [facility]

## 2019-01-03 DIAGNOSIS — Z23 Encounter for immunization: Secondary | ICD-10-CM | POA: Diagnosis not present

## 2019-01-03 DIAGNOSIS — O26891 Other specified pregnancy related conditions, first trimester: Secondary | ICD-10-CM | POA: Diagnosis not present

## 2019-01-03 DIAGNOSIS — N911 Secondary amenorrhea: Secondary | ICD-10-CM | POA: Diagnosis not present

## 2019-01-03 DIAGNOSIS — Z3201 Encounter for pregnancy test, result positive: Secondary | ICD-10-CM | POA: Diagnosis not present

## 2019-01-03 DIAGNOSIS — Z3A10 10 weeks gestation of pregnancy: Secondary | ICD-10-CM | POA: Diagnosis not present

## 2019-01-31 DIAGNOSIS — Z113 Encounter for screening for infections with a predominantly sexual mode of transmission: Secondary | ICD-10-CM | POA: Diagnosis not present

## 2019-01-31 DIAGNOSIS — O0932 Supervision of pregnancy with insufficient antenatal care, second trimester: Secondary | ICD-10-CM | POA: Diagnosis not present

## 2019-01-31 DIAGNOSIS — Z368A Encounter for antenatal screening for other genetic defects: Secondary | ICD-10-CM | POA: Diagnosis not present

## 2019-01-31 DIAGNOSIS — A5900 Urogenital trichomoniasis, unspecified: Secondary | ICD-10-CM | POA: Diagnosis not present

## 2019-01-31 DIAGNOSIS — K219 Gastro-esophageal reflux disease without esophagitis: Secondary | ICD-10-CM | POA: Diagnosis not present

## 2019-01-31 DIAGNOSIS — O23599 Infection of other part of genital tract in pregnancy, unspecified trimester: Secondary | ICD-10-CM | POA: Diagnosis not present

## 2019-01-31 DIAGNOSIS — Z3A14 14 weeks gestation of pregnancy: Secondary | ICD-10-CM | POA: Diagnosis not present

## 2019-01-31 DIAGNOSIS — Z3689 Encounter for other specified antenatal screening: Secondary | ICD-10-CM | POA: Diagnosis not present

## 2019-01-31 DIAGNOSIS — N898 Other specified noninflammatory disorders of vagina: Secondary | ICD-10-CM | POA: Diagnosis not present

## 2019-01-31 DIAGNOSIS — R112 Nausea with vomiting, unspecified: Secondary | ICD-10-CM | POA: Diagnosis not present

## 2019-01-31 DIAGNOSIS — R51 Headache: Secondary | ICD-10-CM | POA: Diagnosis not present

## 2019-01-31 DIAGNOSIS — Z124 Encounter for screening for malignant neoplasm of cervix: Secondary | ICD-10-CM | POA: Diagnosis not present

## 2019-02-01 DIAGNOSIS — Z124 Encounter for screening for malignant neoplasm of cervix: Secondary | ICD-10-CM | POA: Diagnosis not present

## 2019-02-01 DIAGNOSIS — Z1151 Encounter for screening for human papillomavirus (HPV): Secondary | ICD-10-CM | POA: Diagnosis not present

## 2019-02-01 LAB — HM PAP SMEAR: HM Pap smear: NEGATIVE

## 2019-02-01 LAB — RESULTS CONSOLE HPV: CHL HPV: NEGATIVE

## 2019-02-18 ENCOUNTER — Encounter (HOSPITAL_COMMUNITY): Payer: Self-pay | Admitting: *Deleted

## 2019-02-18 ENCOUNTER — Inpatient Hospital Stay (HOSPITAL_COMMUNITY)
Admission: AD | Admit: 2019-02-18 | Discharge: 2019-02-18 | Disposition: A | Discharge status: Home / Self Care | Payer: Medicaid Other | Attending: Obstetrics and Gynecology | Admitting: Obstetrics and Gynecology

## 2019-02-18 DIAGNOSIS — R109 Unspecified abdominal pain: Secondary | ICD-10-CM | POA: Diagnosis not present

## 2019-02-18 DIAGNOSIS — Z880 Allergy status to penicillin: Secondary | ICD-10-CM | POA: Insufficient documentation

## 2019-02-18 DIAGNOSIS — O368120 Decreased fetal movements, second trimester, not applicable or unspecified: Secondary | ICD-10-CM | POA: Diagnosis not present

## 2019-02-18 DIAGNOSIS — Z87891 Personal history of nicotine dependence: Secondary | ICD-10-CM | POA: Diagnosis not present

## 2019-02-18 DIAGNOSIS — O26892 Other specified pregnancy related conditions, second trimester: Secondary | ICD-10-CM | POA: Diagnosis not present

## 2019-02-18 DIAGNOSIS — Z3A16 16 weeks gestation of pregnancy: Secondary | ICD-10-CM | POA: Insufficient documentation

## 2019-02-18 DIAGNOSIS — O26899 Other specified pregnancy related conditions, unspecified trimester: Secondary | ICD-10-CM | POA: Diagnosis present

## 2019-02-18 NOTE — MAU Provider Note (Addendum)
Patient Robin [name] is a 28 y.o. G3P2002 at [redacted]w[redacted]d here with complaints of decreased fetal movements. She also had some cramping this morning. SHe spoke with Dr. Ellyn Hack, who recommended that she be assessed here.   She denies vaginal bleeding, vaginal discharge, dysuria. She gets pelvic pressure when she tries to sleep. She denies any complications in this pregnancy; she has had uncomplicated pregnancies and NSVDs in the past x 2.  History     CSN: [id number]  Arrival date and time: 02/18/19 1609   First Provider Initiated Contact with Patient 02/18/19 1636      Chief Complaint  Patient presents with  . Decreased Fetal Movement   Abdominal Cramping  This is a new problem. The current episode started today. The problem occurs intermittently. The pain is at a severity of 9/10. The quality of the pain is cramping. The abdominal pain radiates to the suprapubic region. Pertinent negatives include no diarrhea, nausea or vomiting. Associated symptoms comments: She had some vomiting two days ago; it went away on its own. She also had some diarrhea with her vomiting. . Nothing aggravates the pain. The pain is relieved by being still.   She states that she is "panicky" and that she just wants to be on the safe side.  OB History    Gravida  3   Para  2   Term  2   Preterm  0   AB  0   Living  2     SAB  0   TAB  0   Ectopic  0   Multiple  0   Live Births  2           Past Medical History:  Diagnosis Date  . ADHD (attention deficit hyperactivity disorder)   . Chronic bronchitis   . Chronic bronchitis (HCC)    2x a year. last episode 2.5 years ago  . Eczema   . Headache(784.0)    miagraine  . No pertinent past medical history   . Normal pregnancy, first 02/04/2012  . Ovarian cyst   . Pregnancy as incidental finding   . Shortness of breath   . SVD (spontaneous vaginal delivery) 02/05/2012    Past Surgical History:  Procedure Laterality Date  . TONSILLECTOMY    .  TONSILLECTOMY    . WISDOM TOOTH EXTRACTION      Family History  Problem Relation Age of Onset  . Diabetes Mother   . Hyperlipidemia Mother   . Chronic bronchitis Mother   . Endometriosis Mother   . Migraines Mother   . Miscarriages / [place] Mother   . Cancer Maternal Aunt        renal  . Cancer Maternal Uncle        breast  . Mental illness Maternal Grandmother        bipolar, paranoid schizophrenia  . COPD Maternal Grandmother   . Depression Maternal Grandmother   . Heart disease Maternal Grandmother   . Hypertension Maternal Grandmother   . Hyperthyroidism Maternal Grandmother   . Cancer Maternal Grandmother        liver  . Migraines Maternal Grandmother   . Miscarriages / Stillbirths Maternal Grandmother   . Heart disease Paternal Grandmother   . Heart disease Paternal Grandfather   . Heart attack Paternal Grandfather   . Stroke Paternal Grandfather   . Heart disease Father   . Obesity Father   . Hypertension Father     Social History   Tobacco Use  .  Smoking status: Former Smoker    Packs/day: 0.50    Years: 1.00    Pack years: 0.50    Types: Cigarettes    Last attempt to quit: 02/11/2019    Years since quitting: 0.0  . Smokeless tobacco: Never Used  Substance Use Topics  . Alcohol use: No  . Drug use: Yes    Types: Marijuana    Allergies:  Allergies  Allergen Reactions  . Penicillins Hives, Itching and Other (See Comments)    Has patient had a PCN reaction causing immediate rash, facial/tongue/throat swelling, SOB or lightheadedness with hypotension: No Has patient had a PCN reaction causing severe rash involving mucus membranes or skin necrosis: No Has patient had a PCN reaction that required hospitalization No Has patient had a PCN reaction occurring within the last 10 years: No If all of the above answers are "NO", then may proceed with Cephalosporin use.    Medications Prior to Admission  Medication Sig Dispense Refill Last Dose  .  clindamycin (CLEOCIN) 150 MG capsule Take 2 capsules (300 mg total) by mouth 3 (three) times daily. May dispense as 150mg  capsules 60 capsule 0   . cyclobenzaprine (FLEXERIL) 10 MG tablet Take 1 tablet (10 mg total) by mouth 3 (three) times daily as needed. 21 tablet 0   . docusate sodium (COLACE) 100 MG capsule Take 1 capsule (100 mg total) by mouth every 12 (twelve) hours. 60 capsule 0   . ibuprofen (ADVIL,MOTRIN) 600 MG tablet Take 1 tablet (600 mg total) by mouth every 6 (six) hours as needed. 30 tablet 0   . naproxen (NAPROSYN) 500 MG tablet Take 1 tablet (500 mg total) by mouth 2 (two) times daily with a meal. 20 tablet 0   . nitrofurantoin, macrocrystal-monohydrate, (MACROBID) 100 MG capsule Take 1 capsule (100 mg total) by mouth 2 (two) times daily. 10 capsule 0   . polyethylene glycol powder (GLYCOLAX/MIRALAX) powder Take 17 g by mouth once. Dissolve 1 capful of powder into any liquid and drink once daily. If no good effect, take twice daily. 500 g 0   . QUEtiapine (SEROQUEL) 50 MG tablet Take 1 tablet (50 mg total) by mouth at bedtime. 30 tablet 0 01/21/2016 at Unknown time  . traMADol (ULTRAM) 50 MG tablet Take 1 tablet (50 mg total) by mouth every 6 (six) hours as needed. 15 tablet 0   . traZODone (DESYREL) 150 MG tablet Take 1 tablet (150 mg total) by mouth at bedtime. 30 tablet 0 01/21/2016 at Unknown time    Review of Systems  Constitutional: Negative.   HENT: Negative.   Eyes: Negative.   Respiratory: Negative.   Gastrointestinal: Negative for diarrhea, nausea and vomiting.  Genitourinary: Negative for decreased urine volume, pelvic pain, vaginal bleeding, vaginal discharge and vaginal pain.  Musculoskeletal: Negative.   Neurological: Negative.   Hematological: Negative.   Psychiatric/Behavioral: Negative.    Physical Exam   Blood pressure 134/71, pulse (!) 107, temperature 98.8 F (37.1 C), temperature source Oral, resp. rate 17, last menstrual period 10/02/2018, SpO2 97  %.  Physical Exam  Constitutional: She is oriented to person, place, and time. She appears well-developed.  HENT:  Head: Normocephalic.  Neck: Normal range of motion.  Respiratory: Effort normal.  GI: Soft.  Musculoskeletal: Normal range of motion.  Neurological: She is alert and oriented to person, place, and time.  Skin: Skin is warm and dry.    MAU Course  Procedures  MDM -Patient declines all swabs today; she was  recently tested and treated for chlamydia and trich at Dr. Beaulah Dinning office.  -Patient doesn't want pelvic exam; feels reassured by fetal heart rate.   -FRH 144  Assessment and Plan   1. Abdominal pain affecting pregnancy    2. Patient stable for discharge with reassurances and explanations of fetal movements and development.   3. Patient to keep ob appt next week at [facility]; MAU note routed to [place].   4. Reviewed warning signs and when to return to MAU; all questions answered.   Charlesetta Garibaldi Kooistra 02/18/2019, 4:43 PM

## 2019-02-18 NOTE — Discharge Instructions (Signed)

## 2019-02-18 NOTE — MAU Note (Signed)
Pt states she has not felt the baby move in a couple of days.  She said she has had some lower abdominal cramping but denies lof/vb.

## 2019-02-28 DIAGNOSIS — Z3A18 18 weeks gestation of pregnancy: Secondary | ICD-10-CM | POA: Diagnosis not present

## 2019-02-28 DIAGNOSIS — O0932 Supervision of pregnancy with insufficient antenatal care, second trimester: Secondary | ICD-10-CM | POA: Diagnosis not present

## 2019-02-28 DIAGNOSIS — Z36 Encounter for antenatal screening for chromosomal anomalies: Secondary | ICD-10-CM | POA: Diagnosis not present

## 2019-02-28 DIAGNOSIS — O23512 Infections of cervix in pregnancy, second trimester: Secondary | ICD-10-CM | POA: Diagnosis not present

## 2019-02-28 DIAGNOSIS — K219 Gastro-esophageal reflux disease without esophagitis: Secondary | ICD-10-CM | POA: Diagnosis not present

## 2019-03-05 DIAGNOSIS — Z3A19 19 weeks gestation of pregnancy: Secondary | ICD-10-CM | POA: Diagnosis not present

## 2019-03-05 DIAGNOSIS — Z3689 Encounter for other specified antenatal screening: Secondary | ICD-10-CM | POA: Diagnosis not present

## 2019-05-03 DIAGNOSIS — Z3689 Encounter for other specified antenatal screening: Secondary | ICD-10-CM | POA: Diagnosis not present

## 2019-06-04 DIAGNOSIS — Z3483 Encounter for supervision of other normal pregnancy, third trimester: Secondary | ICD-10-CM | POA: Diagnosis not present

## 2019-06-04 DIAGNOSIS — K219 Gastro-esophageal reflux disease without esophagitis: Secondary | ICD-10-CM | POA: Diagnosis not present

## 2019-06-04 DIAGNOSIS — Z3A32 32 weeks gestation of pregnancy: Secondary | ICD-10-CM | POA: Diagnosis not present

## 2019-06-04 DIAGNOSIS — Z113 Encounter for screening for infections with a predominantly sexual mode of transmission: Secondary | ICD-10-CM | POA: Diagnosis not present

## 2019-06-27 DIAGNOSIS — O368130 Decreased fetal movements, third trimester, not applicable or unspecified: Secondary | ICD-10-CM | POA: Diagnosis not present

## 2019-06-27 DIAGNOSIS — Z3A35 35 weeks gestation of pregnancy: Secondary | ICD-10-CM | POA: Diagnosis not present

## 2019-07-03 DIAGNOSIS — Z113 Encounter for screening for infections with a predominantly sexual mode of transmission: Secondary | ICD-10-CM | POA: Diagnosis not present

## 2019-07-03 DIAGNOSIS — Z3685 Encounter for antenatal screening for Streptococcus B: Secondary | ICD-10-CM | POA: Diagnosis not present

## 2019-07-03 LAB — OB RESULTS CONSOLE GBS: GBS: NEGATIVE

## 2019-07-10 DIAGNOSIS — O368139 Decreased fetal movements, third trimester, other fetus: Secondary | ICD-10-CM | POA: Diagnosis not present

## 2019-07-10 DIAGNOSIS — Z3A37 37 weeks gestation of pregnancy: Secondary | ICD-10-CM | POA: Diagnosis not present

## 2019-07-24 DIAGNOSIS — O1200 Gestational edema, unspecified trimester: Secondary | ICD-10-CM | POA: Diagnosis not present

## 2019-07-31 ENCOUNTER — Other Ambulatory Visit: Payer: Self-pay | Admitting: Obstetrics and Gynecology

## 2019-07-31 NOTE — H&P (Signed)
Robin [name] is a 28 y.o. female G3P2002 at 6040+ for IOL given ost dates and favorable.  PNC complicated by Chl x2 with neg TOC, GERD, HA and late entry to care - about 14 weeks.  Dated by early US.  GBBS negative.    OB History    Gravida  3   Para  2   Term  2   Preterm  0   AB  0   Living  2     SAB  0   TAB  0   Ectopic  0   Multiple  0   Live Births  2         G1 SVD 6#11 SVD female G2 6# SVD female G3 present  No abn pap, last 2/20, + STD Chl x 2 in pregnancy, trich   Past Medical History:  Diagnosis Date  . ADHD (attention deficit hyperactivity disorder)   . Chronic bronchitis   . Chronic bronchitis (HCC)    2x a year. last episode 2.5 years ago  . Eczema   . Headache(784.0)    miagraine  . No pertinent past medical history   . Normal pregnancy, first 02/04/2012  . Ovarian cyst   . Pregnancy as incidental finding   . Shortness of breath   . SVD (spontaneous vaginal delivery) 02/05/2012   Past Surgical History:  Procedure Laterality Date  . TONSILLECTOMY    . TONSILLECTOMY    . WISDOM TOOTH EXTRACTION     Family History: family history includes COPD in her maternal grandmother; Cancer in her maternal aunt, maternal grandmother, and maternal uncle; Chronic bronchitis in her mother; Depression in her maternal grandmother; Diabetes in her mother; Endometriosis in her mother; Heart attack in her paternal grandfather; Heart disease in her father, maternal grandmother, paternal grandfather, and paternal grandmother; Hyperlipidemia in her mother; Hypertension in her father and maternal grandmother; Hyperthyroidism in her maternal grandmother; Mental illness in her maternal grandmother; Migraines in her maternal grandmother and mother; Miscarriages / Stillbirths in her maternal grandmother and mother; Obesity in her father; Stroke in her paternal grandfather. Social History:  reports that she quit smoking about 5 months ago. Her smoking use included cigarettes. She  has a 0.50 pack-year smoking history. She has never used smokeless tobacco. She reports current drug use. Drug: Marijuana. She reports that she does not drink alcohol.  Single, unemployed  Meds Pepcid, PNV All PCN     Maternal Diabetes: No Genetic Screening: Normal Maternal Ultrasounds/Referrals: Normal Fetal Ultrasounds or other Referrals:  None Maternal Substance Abuse:  Yes:  Type: Smoker, Marijuana Significant Maternal Medications:  None Significant Maternal Lab Results:  Group B Strep negative Other Comments:  None  Review of Systems  Constitutional: Negative.   HENT: Negative.   Eyes: Negative.   Respiratory: Negative.   Cardiovascular: Negative.   Gastrointestinal: Negative.   Genitourinary: Negative.   Musculoskeletal: Positive for back pain.  Skin: Negative.   Neurological: Negative.   Psychiatric/Behavioral: Negative.    Maternal Medical History:  Contractions: Frequency: irregular.    Fetal activity: Perceived fetal activity is normal.    Prenatal Complications - Diabetes: none.      Last menstrual period 10/02/2018. Maternal Exam:  Uterine Assessment: Contraction frequency is irregular.   Abdomen: Patient reports no abdominal tenderness. Fundal height is appropriate for gestation.   Estimated fetal weight is 6.5-7#.    Introitus: Normal vulva. Normal vagina.  Cervix: Cervix evaluated by digital exam.     Physical Exam  Constitutional: She is oriented to person, place, and time. She appears well-developed and well-nourished.  HENT:  Head: Normocephalic and atraumatic.  Cardiovascular: Normal rate and regular rhythm.  Respiratory: Effort normal and breath sounds normal. No respiratory distress. She has no wheezes.  GI: Soft. Bowel sounds are normal. She exhibits no distension. There is no abdominal tenderness.  Genitourinary:    Vulva normal.   Musculoskeletal: Normal range of motion.  Neurological: She is alert and oriented to person, place, and  time.  Skin: Skin is warm and dry.  Psychiatric: She has a normal mood and affect. Her behavior is normal.    Prenatal labs: ABO, Rh:  O+ Antibody:  neg Rubella:  immune RPR:   NR HBsAg:   neg HIV:   neg GBS:   neg  Hgb 11.6/Plt 357/Ur Cx neg/GC neg/Chl +/TOC neg/ Chl + again/TOC neg/ Varicella immune/Hgb electro WNL/AFP WNL/ glucola 97/gbbs neg/also trich +/  Nl anat, female  Assessment/Plan: 32RJ [id number] at 40+ for IOL gbbs neg no prophylaxis IOL with AROM/Ptirocin Epidural/stadol prn   Robin [name] 07/31/2019, 7:14 PM

## 2019-08-01 ENCOUNTER — Inpatient Hospital Stay (HOSPITAL_COMMUNITY)
Admission: AD | Admit: 2019-08-01 | Discharge: 2019-08-03 | DRG: 806 | Disposition: A | Discharge status: Home / Self Care | Payer: Medicaid Other | Attending: Obstetrics and Gynecology | Admitting: Obstetrics and Gynecology

## 2019-08-01 ENCOUNTER — Inpatient Hospital Stay (HOSPITAL_COMMUNITY): Payer: Medicaid Other

## 2019-08-01 ENCOUNTER — Other Ambulatory Visit: Payer: Self-pay

## 2019-08-01 ENCOUNTER — Inpatient Hospital Stay (HOSPITAL_COMMUNITY): Payer: Medicaid Other | Admitting: Anesthesiology

## 2019-08-01 ENCOUNTER — Encounter (HOSPITAL_COMMUNITY): Payer: Self-pay | Admitting: *Deleted

## 2019-08-01 DIAGNOSIS — O480 Post-term pregnancy: Secondary | ICD-10-CM | POA: Diagnosis present

## 2019-08-01 DIAGNOSIS — F1290 Cannabis use, unspecified, uncomplicated: Secondary | ICD-10-CM | POA: Diagnosis present

## 2019-08-01 DIAGNOSIS — O99324 Drug use complicating childbirth: Secondary | ICD-10-CM | POA: Diagnosis present

## 2019-08-01 DIAGNOSIS — Z3480 Encounter for supervision of other normal pregnancy, unspecified trimester: Secondary | ICD-10-CM

## 2019-08-01 DIAGNOSIS — Z20828 Contact with and (suspected) exposure to other viral communicable diseases: Secondary | ICD-10-CM | POA: Diagnosis present

## 2019-08-01 DIAGNOSIS — Z3A40 40 weeks gestation of pregnancy: Secondary | ICD-10-CM | POA: Diagnosis not present

## 2019-08-01 DIAGNOSIS — O26893 Other specified pregnancy related conditions, third trimester: Secondary | ICD-10-CM | POA: Diagnosis present

## 2019-08-01 LAB — RAPID URINE DRUG SCREEN, HOSP PERFORMED
Amphetamines: NOT DETECTED
Barbiturates: NOT DETECTED
Benzodiazepines: NOT DETECTED
Cocaine: NOT DETECTED
Opiates: NOT DETECTED
Tetrahydrocannabinol: POSITIVE — AB

## 2019-08-01 LAB — TYPE AND SCREEN
ABO/RH(D): O POS
Antibody Screen: NEGATIVE

## 2019-08-01 LAB — CBC
HCT: 30.8 % — ABNORMAL LOW (ref 36.0–46.0)
Hemoglobin: 10.1 g/dL — ABNORMAL LOW (ref 12.0–15.0)
MCH: 27.7 pg (ref 26.0–34.0)
MCHC: 32.8 g/dL (ref 30.0–36.0)
MCV: 84.6 fL (ref 80.0–100.0)
Platelets: 373 10*3/uL (ref 150–400)
RBC: 3.64 MIL/uL — ABNORMAL LOW (ref 3.87–5.11)
RDW: 14.5 % (ref 11.5–15.5)
WBC: 11.5 10*3/uL — ABNORMAL HIGH (ref 4.0–10.5)
nRBC: 0 % (ref 0.0–0.2)

## 2019-08-01 LAB — SARS CORONAVIRUS 2 BY RT PCR (HOSPITAL ORDER, PERFORMED IN ~~LOC~~ HOSPITAL LAB): SARS Coronavirus 2: NEGATIVE

## 2019-08-01 LAB — ABO/RH: ABO/RH(D): O POS

## 2019-08-01 MED ORDER — ACETAMINOPHEN 325 MG PO TABS: 650.0000 mg | ORAL_TABLET | ORAL | Status: DC | PRN

## 2019-08-01 MED ORDER — ONDANSETRON HCL 4 MG/2ML IJ SOLN: 4.0000 mg | INTRAMUSCULAR | Status: DC | PRN

## 2019-08-01 MED ORDER — OXYCODONE HCL 5 MG PO TABS: 10.0000 mg | ORAL_TABLET | ORAL | Status: DC | PRN

## 2019-08-01 MED ORDER — SOD CITRATE-CITRIC ACID 500-334 MG/5ML PO SOLN
30.0000 mL | ORAL | Status: DC | PRN
  Administered 2019-08-01: 30 mL via ORAL
  Filled 2019-08-01: qty 30

## 2019-08-01 MED ORDER — ZOLPIDEM TARTRATE 5 MG PO TABS: 5.0000 mg | ORAL_TABLET | Freq: Every evening | ORAL | Status: DC | PRN

## 2019-08-01 MED ORDER — IBUPROFEN 600 MG PO TABS
600.0000 mg | ORAL_TABLET | Freq: Four times a day (QID) | ORAL | Status: DC
  Administered 2019-08-01 – 2019-08-03 (×7): 600 mg via ORAL
  Filled 2019-08-01 (×7): qty 1

## 2019-08-01 MED ORDER — COCONUT OIL OIL: 1.0000 "application " | TOPICAL_OIL | Status: DC | PRN

## 2019-08-01 MED ORDER — OXYCODONE-ACETAMINOPHEN 5-325 MG PO TABS: 1.0000 | ORAL_TABLET | ORAL | Status: DC | PRN

## 2019-08-01 MED ORDER — FENTANYL-BUPIVACAINE-NACL 0.5-0.125-0.9 MG/250ML-% EP SOLN
12.0000 mL/h | EPIDURAL | Status: DC | PRN
  Filled 2019-08-01: qty 250

## 2019-08-01 MED ORDER — ONDANSETRON HCL 4 MG/2ML IJ SOLN
4.0000 mg | Freq: Four times a day (QID) | INTRAMUSCULAR | Status: DC | PRN
  Administered 2019-08-01: 4 mg via INTRAVENOUS
  Filled 2019-08-01: qty 2

## 2019-08-01 MED ORDER — LIDOCAINE HCL (PF) 1 % IJ SOLN
INTRAMUSCULAR | Status: AC
  Filled 2019-08-01: qty 30

## 2019-08-01 MED ORDER — WITCH HAZEL-GLYCERIN EX PADS: 1.0000 "application " | MEDICATED_PAD | CUTANEOUS | Status: DC | PRN

## 2019-08-01 MED ORDER — SIMETHICONE 80 MG PO CHEW
80.0000 mg | CHEWABLE_TABLET | ORAL | Status: DC | PRN
  Administered 2019-08-02: 06:00:00 80 mg via ORAL
  Filled 2019-08-01: qty 1

## 2019-08-01 MED ORDER — LACTATED RINGERS IV SOLN: 500.0000 mL | INTRAVENOUS | Status: DC | PRN

## 2019-08-01 MED ORDER — TETANUS-DIPHTH-ACELL PERTUSSIS 5-2.5-18.5 LF-MCG/0.5 IM SUSP: 0.5000 mL | Freq: Once | INTRAMUSCULAR | Status: DC

## 2019-08-01 MED ORDER — OXYTOCIN 40 UNITS IN NORMAL SALINE INFUSION - SIMPLE MED: 2.5000 [IU]/h | INTRAVENOUS | Status: DC

## 2019-08-01 MED ORDER — DIBUCAINE (PERIANAL) 1 % EX OINT: 1.0000 "application " | TOPICAL_OINTMENT | CUTANEOUS | Status: DC | PRN

## 2019-08-01 MED ORDER — DIPHENHYDRAMINE HCL 50 MG/ML IJ SOLN: 12.5000 mg | INTRAMUSCULAR | Status: DC | PRN

## 2019-08-01 MED ORDER — SENNOSIDES-DOCUSATE SODIUM 8.6-50 MG PO TABS
2.0000 | ORAL_TABLET | ORAL | Status: DC
  Administered 2019-08-01 – 2019-08-02 (×2): 2 via ORAL
  Filled 2019-08-01 (×2): qty 2

## 2019-08-01 MED ORDER — PRENATAL MULTIVITAMIN CH
1.0000 | ORAL_TABLET | Freq: Every day | ORAL | Status: DC
  Administered 2019-08-02 – 2019-08-03 (×2): 1 via ORAL
  Filled 2019-08-01 (×2): qty 1

## 2019-08-01 MED ORDER — EPHEDRINE 5 MG/ML INJ: 10.0000 mg | INTRAVENOUS | Status: DC | PRN

## 2019-08-01 MED ORDER — BENZOCAINE-MENTHOL 20-0.5 % EX AERO
1.0000 "application " | INHALATION_SPRAY | CUTANEOUS | Status: DC | PRN
  Administered 2019-08-01: 1 via TOPICAL
  Filled 2019-08-01: qty 56

## 2019-08-01 MED ORDER — LACTATED RINGERS IV SOLN: INTRAVENOUS | Status: DC

## 2019-08-01 MED ORDER — TERBUTALINE SULFATE 1 MG/ML IJ SOLN: 0.2500 mg | Freq: Once | INTRAMUSCULAR | Status: DC | PRN

## 2019-08-01 MED ORDER — ACETAMINOPHEN 325 MG PO TABS
650.0000 mg | ORAL_TABLET | ORAL | Status: DC | PRN
  Administered 2019-08-02 – 2019-08-03 (×4): 650 mg via ORAL
  Filled 2019-08-01 (×4): qty 2

## 2019-08-01 MED ORDER — OXYTOCIN 40 UNITS IN NORMAL SALINE INFUSION - SIMPLE MED
1.0000 m[IU]/min | INTRAVENOUS | Status: DC
  Administered 2019-08-01: 2 m[IU]/min via INTRAVENOUS
  Filled 2019-08-01: qty 1000

## 2019-08-01 MED ORDER — BUTORPHANOL TARTRATE 1 MG/ML IJ SOLN: 1.0000 mg | INTRAMUSCULAR | Status: DC | PRN

## 2019-08-01 MED ORDER — ONDANSETRON HCL 4 MG PO TABS: 4.0000 mg | ORAL_TABLET | ORAL | Status: DC | PRN

## 2019-08-01 MED ORDER — SODIUM CHLORIDE (PF) 0.9 % IJ SOLN
INTRAMUSCULAR | Status: DC | PRN
  Administered 2019-08-01: 12 mL/h via EPIDURAL

## 2019-08-01 MED ORDER — OXYCODONE HCL 5 MG PO TABS
5.0000 mg | ORAL_TABLET | ORAL | Status: DC | PRN
  Administered 2019-08-02 (×3): 5 mg via ORAL
  Filled 2019-08-01 (×3): qty 1

## 2019-08-01 MED ORDER — DIPHENHYDRAMINE HCL 25 MG PO CAPS
25.0000 mg | ORAL_CAPSULE | Freq: Four times a day (QID) | ORAL | Status: DC | PRN
  Administered 2019-08-01: 23:00:00 25 mg via ORAL
  Filled 2019-08-01: qty 1

## 2019-08-01 MED ORDER — OXYTOCIN 40 UNITS IN NORMAL SALINE INFUSION - SIMPLE MED: 1.0000 m[IU]/min | INTRAVENOUS | Status: DC

## 2019-08-01 MED ORDER — PHENYLEPHRINE 40 MCG/ML (10ML) SYRINGE FOR IV PUSH (FOR BLOOD PRESSURE SUPPORT): 80.0000 ug | PREFILLED_SYRINGE | INTRAVENOUS | Status: DC | PRN

## 2019-08-01 MED ORDER — PHENYLEPHRINE 40 MCG/ML (10ML) SYRINGE FOR IV PUSH (FOR BLOOD PRESSURE SUPPORT)
80.0000 ug | PREFILLED_SYRINGE | INTRAVENOUS | Status: DC | PRN
  Filled 2019-08-01: qty 10

## 2019-08-01 MED ORDER — LIDOCAINE HCL (PF) 1 % IJ SOLN
INTRAMUSCULAR | Status: DC | PRN
  Administered 2019-08-01 (×2): 4 mL via EPIDURAL

## 2019-08-01 MED ORDER — LIDOCAINE HCL (PF) 1 % IJ SOLN
30.0000 mL | INTRAMUSCULAR | Status: AC | PRN
  Administered 2019-08-01: 30 mL via SUBCUTANEOUS

## 2019-08-01 MED ORDER — OXYTOCIN BOLUS FROM INFUSION
500.0000 mL | Freq: Once | INTRAVENOUS | Status: AC
  Administered 2019-08-01: 500 mL via INTRAVENOUS

## 2019-08-01 MED ORDER — OXYCODONE-ACETAMINOPHEN 5-325 MG PO TABS: 2.0000 | ORAL_TABLET | ORAL | Status: DC | PRN

## 2019-08-01 MED ORDER — LACTATED RINGERS IV SOLN
500.0000 mL | Freq: Once | INTRAVENOUS | Status: AC
  Administered 2019-08-01: 18:00:00 500 mL via INTRAVENOUS

## 2019-08-01 NOTE — Anesthesia Procedure Notes (Addendum)
Epidural Patient location during procedure: OB Start time: 08/01/2019 6:15 PM End time: 08/01/2019 6:23 PM  Staffing Anesthesiologist: Josephine Igo, MD Performed: anesthesiologist   Preanesthetic Checklist Completed: patient identified, site marked, surgical consent, pre-op evaluation, timeout performed, IV checked, risks and benefits discussed and monitors and equipment checked  Epidural Patient position: sitting Prep: site prepped and draped and DuraPrep Patient monitoring: continuous pulse ox and blood pressure Approach: midline Location: L3-L4 Injection technique: LOR air  Needle:  Needle type: Tuohy  Needle gauge: 17 G Needle length: 9 cm and 9 Needle insertion depth: 7 cm Catheter type: closed end flexible Catheter size: 19 Gauge Catheter at skin depth: 12 cm Test dose: negative and Other  Assessment Events: blood not aspirated, injection not painful, no injection resistance, negative IV test and no paresthesia  Additional Notes Patient identified. Risks and benefits discussed including failed block, incomplete  Pain control, post dural puncture headache, nerve damage, paralysis, blood pressure Changes, nausea, vomiting, reactions to medications-both toxic and allergic and post Partum back pain. All questions were answered. Patient expressed understanding and wished to proceed. Sterile technique was used throughout procedure. Epidural site was Dressed with sterile barrier dressing. No paresthesias, signs of intravascular injection Or signs of intrathecal spread were encountered.  Patient was more comfortable after the epidural was dosed. Please see RN's note for documentation of vital signs and FHR which are stable. Reason for block:procedure for pain

## 2019-08-01 NOTE — Progress Notes (Signed)
Patient ID: Robin [name], female   DOB: 08/22/1991, 28 y.o.   MRN: [id number]   Getting uncomfortable with ctx  AFVSS gen NAD FHTs 130, mod var, +accels, category 1 toco Q 4-78min  AROM for clear fluid w/o diff/comp  Continue IOL, expect SVD

## 2019-08-01 NOTE — Progress Notes (Signed)
Patient ID: Robin [name], female   DOB: 07-25-1991, 28 y.o.   MRN: [id number]   Feeling some pressure  AFVSS gen NAD FHTs 120's, mod var, +accels, category 1 toco q 2-3 min  SVE 4/90/0  Continue IOL, expect SVD

## 2019-08-01 NOTE — Progress Notes (Signed)
Patient ID: Robin [name], female   DOB: Jun 19, 1991, 28 y.o.   MRN: [id number]  FSE applied

## 2019-08-01 NOTE — Progress Notes (Signed)
Patient ID: Robin [name], female   DOB: Jul 19, 1991, 28 y.o.   MRN: [id number]   Called with rapid dilation to 9.5/100/+1 - pt with severe pressure  Rapid SVD

## 2019-08-01 NOTE — Anesthesia Preprocedure Evaluation (Signed)
Anesthesia Evaluation  Patient identified by MRN, date of birth, ID band Patient awake    Reviewed: Allergy & Precautions, H&P , NPO status , Patient's Chart, lab work & pertinent test results  Airway Mallampati: I  TM Distance: >3 FB Neck ROM: full    Dental no notable dental hx. (+) Teeth Intact   Pulmonary former smoker,    Pulmonary exam normal breath sounds clear to auscultation       Cardiovascular negative cardio ROS Normal cardiovascular exam Rhythm:regular Rate:Normal     Neuro/Psych    GI/Hepatic negative GI ROS, Neg liver ROS,   Endo/Other  negative endocrine ROS  Renal/GU negative Renal ROS     Musculoskeletal negative musculoskeletal ROS (+)   Abdominal Normal abdominal exam  (+)   Peds  Hematology negative hematology ROS (+)   Anesthesia Other Findings   Reproductive/Obstetrics (+) Pregnancy                             Anesthesia Physical Anesthesia Plan  ASA: II  Anesthesia Plan: Epidural   Post-op Pain Management:    Induction:   PONV Risk Score and Plan:   Airway Management Planned:   Additional Equipment:   Intra-op Plan:   Post-operative Plan:   Informed Consent: I have reviewed the patients History and Physical, chart, labs and discussed the procedure including the risks, benefits and alternatives for the proposed anesthesia with the patient or authorized representative who has indicated his/her understanding and acceptance.     Plan Discussed with:   Anesthesia Plan Comments:         Anesthesia Quick Evaluation  

## 2019-08-01 NOTE — MAU Note (Signed)
Covid swab collected. Pt tolerated well. Pt asymptomatic 

## 2019-08-02 LAB — CBC
HCT: 27 % — ABNORMAL LOW (ref 36.0–46.0)
Hemoglobin: 9 g/dL — ABNORMAL LOW (ref 12.0–15.0)
MCH: 28.5 pg (ref 26.0–34.0)
MCHC: 33.3 g/dL (ref 30.0–36.0)
MCV: 85.4 fL (ref 80.0–100.0)
Platelets: 322 10*3/uL (ref 150–400)
RBC: 3.16 MIL/uL — ABNORMAL LOW (ref 3.87–5.11)
RDW: 14.4 % (ref 11.5–15.5)
WBC: 14.4 10*3/uL — ABNORMAL HIGH (ref 4.0–10.5)
nRBC: 0 % (ref 0.0–0.2)

## 2019-08-02 LAB — RPR: RPR Ser Ql: NONREACTIVE
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
## 2019-08-02 NOTE — Anesthesia Postprocedure Evaluation (Signed)
Anesthesia Post Note  Patient: Robin [name]  Procedure(s) Performed: AN AD HOC LABOR EPIDURAL     Patient location during evaluation: Mother Baby Anesthesia Type: Epidural Level of consciousness: awake and alert and oriented Pain management: satisfactory to patient Vital Signs Assessment: post-procedure vital signs reviewed and stable Respiratory status: spontaneous breathing and nonlabored ventilation Cardiovascular status: stable Postop Assessment: no headache, no backache, no signs of nausea or vomiting, adequate PO intake, patient able to bend at knees and able to ambulate (patient up walking) Anesthetic complications: no    Last Vitals:  Vitals:   08/01/19 2315 08/02/19 0614  BP: 108/73 113/66  Pulse: 77 61  Resp: 18 17  Temp: 37 C 36.6 C  SpO2: 100% 100%    Last Pain:  Vitals:   08/02/19 0614  TempSrc: Oral  PainSc:    Pain Goal: Patients Stated Pain Goal: 3 (08/02/19 0511)                 Willa Rough

## 2019-08-02 NOTE — Progress Notes (Signed)
PPD #1 No problems Afeb, VSS Fundus firm, NT at U-1 Continue routine postpartum care 

## 2019-08-03 MED ORDER — PRENATAL MULTIVITAMIN CH: 1.0000 | ORAL_TABLET | Freq: Every day | ORAL | 0 refills | Status: DC

## 2019-08-03 MED ORDER — IBUPROFEN 600 MG PO TABS: 600.0000 mg | ORAL_TABLET | Freq: Four times a day (QID) | ORAL | 1 refills | Status: DC | PRN
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
## 2019-10-03 DIAGNOSIS — Z3046 Encounter for surveillance of implantable subdermal contraceptive: Secondary | ICD-10-CM | POA: Diagnosis not present

## 2019-10-03 DIAGNOSIS — F53 Postpartum depression: Secondary | ICD-10-CM | POA: Diagnosis not present

## 2019-10-03 DIAGNOSIS — Z3202 Encounter for pregnancy test, result negative: Secondary | ICD-10-CM | POA: Diagnosis not present

## 2019-10-03 DIAGNOSIS — R309 Painful micturition, unspecified: Secondary | ICD-10-CM | POA: Diagnosis not present

## 2019-10-03 DIAGNOSIS — Z202 Contact with and (suspected) exposure to infections with a predominantly sexual mode of transmission: Secondary | ICD-10-CM | POA: Diagnosis not present

## 2019-12-22 ENCOUNTER — Encounter: Payer: Self-pay | Admitting: Emergency Medicine

## 2019-12-22 ENCOUNTER — Emergency Department
Admission: EM | Admit: 2019-12-22 | Discharge: 2019-12-23 | Disposition: A | Discharge status: Home / Self Care | Payer: Medicaid Other | Attending: Emergency Medicine | Admitting: Emergency Medicine

## 2019-12-22 ENCOUNTER — Other Ambulatory Visit: Payer: Self-pay

## 2019-12-22 DIAGNOSIS — K0889 Other specified disorders of teeth and supporting structures: Secondary | ICD-10-CM | POA: Diagnosis not present

## 2019-12-22 DIAGNOSIS — Z5321 Procedure and treatment not carried out due to patient leaving prior to being seen by health care provider: Secondary | ICD-10-CM | POA: Diagnosis not present

## 2019-12-22 NOTE — ED Triage Notes (Signed)
Patient with complaint of multiple teeth hurting times one month. Patient states that the pain has become worse.

## 2019-12-23 NOTE — ED Triage Notes (Signed)
Pt called from [facility] to treatment room, no response 

## 2020-02-12 ENCOUNTER — Other Ambulatory Visit: Payer: Self-pay

## 2020-02-12 ENCOUNTER — Encounter: Payer: Self-pay | Admitting: Emergency Medicine

## 2020-02-12 DIAGNOSIS — Z880 Allergy status to penicillin: Secondary | ICD-10-CM | POA: Diagnosis not present

## 2020-02-12 DIAGNOSIS — R112 Nausea with vomiting, unspecified: Secondary | ICD-10-CM | POA: Diagnosis present

## 2020-02-12 DIAGNOSIS — F17210 Nicotine dependence, cigarettes, uncomplicated: Secondary | ICD-10-CM | POA: Insufficient documentation

## 2020-02-12 DIAGNOSIS — Z20822 Contact with and (suspected) exposure to covid-19: Secondary | ICD-10-CM | POA: Diagnosis not present

## 2020-02-12 DIAGNOSIS — B349 Viral infection, unspecified: Secondary | ICD-10-CM | POA: Insufficient documentation

## 2020-02-12 LAB — POCT PREGNANCY, URINE: Preg Test, Ur: NEGATIVE

## 2020-02-12 NOTE — ED Triage Notes (Signed)
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

## 2020-02-12 NOTE — ED Triage Notes (Signed)
Pt arrived via POV with reports of vomiting, body aches, cough and feeling like her taste buds are messed up.  Sxs started 2 days ago.  Denies any COVID contacts, but states she recently started a new job.   C/o chills as well.

## 2020-02-13 ENCOUNTER — Emergency Department
Admission: EM | Admit: 2020-02-13 | Discharge: 2020-02-13 | Disposition: A | Discharge status: Home / Self Care | Payer: Medicaid Other | Attending: Emergency Medicine | Admitting: Emergency Medicine

## 2020-02-13 DIAGNOSIS — B349 Viral infection, unspecified: Secondary | ICD-10-CM

## 2020-02-13 LAB — URINALYSIS, COMPLETE (UACMP) WITH MICROSCOPIC
Bilirubin Urine: NEGATIVE
Glucose, UA: NEGATIVE mg/dL
Hgb urine dipstick: NEGATIVE
Ketones, ur: NEGATIVE mg/dL
Leukocytes,Ua: NEGATIVE
Nitrite: NEGATIVE
Protein, ur: NEGATIVE mg/dL
Specific Gravity, Urine: 1.024 (ref 1.005–1.030)
pH: 5 (ref 5.0–8.0)

## 2020-02-13 LAB — COMPREHENSIVE METABOLIC PANEL
ALT: 16 U/L (ref 0–44)
AST: 17 U/L (ref 15–41)
Albumin: 4.1 g/dL (ref 3.5–5.0)
Alkaline Phosphatase: 60 U/L (ref 38–126)
Anion gap: 8 (ref 5–15)
BUN: 9 mg/dL (ref 6–20)
CO2: 21 mmol/L — ABNORMAL LOW (ref 22–32)
Calcium: 8.7 mg/dL — ABNORMAL LOW (ref 8.9–10.3)
Chloride: 110 mmol/L (ref 98–111)
Creatinine, Ser: 0.6 mg/dL (ref 0.44–1.00)
GFR calc Af Amer: >60 mL/min (ref >60)
GFR calc non Af Amer: >60 mL/min (ref >60)
Glucose, Bld: 106 mg/dL — ABNORMAL HIGH (ref 70–99)
Potassium: 3.9 mmol/L (ref 3.5–5.1)
Sodium: 139 mmol/L (ref 135–145)
Total Bilirubin: 0.6 mg/dL (ref 0.3–1.2)
Total Protein: 7.1 g/dL (ref 6.5–8.1)

## 2020-02-13 LAB — CBC
HCT: 34.8 % — ABNORMAL LOW (ref 36.0–46.0)
Hemoglobin: 11.1 g/dL — ABNORMAL LOW (ref 12.0–15.0)
MCH: 26.7 pg (ref 26.0–34.0)
MCHC: 31.9 g/dL (ref 30.0–36.0)
MCV: 83.7 fL (ref 80.0–100.0)
Platelets: 332 10*3/uL (ref 150–400)
RBC: 4.16 MIL/uL (ref 3.87–5.11)
RDW: 15.2 % (ref 11.5–15.5)
WBC: 6.8 10*3/uL (ref 4.0–10.5)
nRBC: 0 % (ref 0.0–0.2)

## 2020-02-13 LAB — LIPASE, BLOOD: Lipase: 25 U/L (ref 11–51)

## 2020-02-13 LAB — POC SARS CORONAVIRUS 2 AG: SARS Coronavirus 2 Ag: NEGATIVE

## 2020-02-13 NOTE — Discharge Instructions (Addendum)
Please quarantine until PCR results have returned

## 2020-02-13 NOTE — ED Provider Notes (Signed)
[facility] Emergency Department Provider Note   ____________________________________________    I have reviewed the triage vital signs and the nursing notes.   HISTORY  Chief Complaint Emesis, Generalized Body Aches, and Cough     HPI Robin [name] is a 29 y.o. female who presents with mild nausea, body aches, dry cough x2 days.  She is concerned because she lives with her elderly grandmother and wants to rule out coronavirus.  She denies shortness of breath.  Does not believe that she has had fevers.  Recently started a new job which she reports is somewhat lax on mask wearing.  No recent travel.  Has not take anything for this.  Reports her "taste buds are weird ", no loss of smell  Past Medical History:  Diagnosis Date  . ADHD (attention deficit hyperactivity disorder)   . Chronic bronchitis   . Chronic bronchitis ([place])    2x a year. last episode 2.5 years ago  . Eczema   . Headache(784.0)    miagraine  . No pertinent past medical history   . Normal pregnancy, first 02/04/2012  . Ovarian cyst   . Pregnancy as incidental finding   . Shortness of breath   . SVD (spontaneous vaginal delivery) 02/05/2012  . SVD (spontaneous vaginal delivery) 08/01/2019    Patient Active Problem List   Diagnosis Date Noted  . Normal pregnancy in multigravida 08/01/2019  . SVD (spontaneous vaginal delivery) 08/01/2019  . Abdominal pain affecting pregnancy 02/18/2019  . Active labor at term 02/16/2015  . Migraine, unspecified, without mention of intractable migraine without mention of status migrainosus 08/15/2014  . 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 Abdominal pregnancy with intrauterine pregnancy 08/15/2014  . Chronic daily headache 08/15/2014    Past Surgical History:  Procedure Laterality Date  . TONSILLECTOMY    . TONSILLECTOMY    . WISDOM TOOTH EXTRACTION      Prior to Admission medications   Medication Sig Start Date End Date Taking?  Authorizing Provider  Famotidine (PEPCID PO) Take by mouth as needed (heartburn).     [provider]  ibuprofen (ADVIL) 600 MG tablet Take 1 tablet (600 mg total) by mouth every 6 (six) hours as needed. 08/03/19   [name]-Stuckert, Jeral Fruit, MD  metoCLOPramide (REGLAN) 10 MG tablet Take 10 mg by mouth as needed for refractory nausea / vomiting.     [provider]  Prenatal Vit-Fe Fumarate-FA (PRENATAL MULTIVITAMIN) TABS tablet Take 1 tablet by mouth daily at 12 noon. 08/03/19   [name]-Stuckert, Jeral Fruit, MD     Allergies Penicillins  Family History  Problem Relation Age of Onset  . Diabetes Mother   . Hyperlipidemia Mother   . Chronic bronchitis Mother   . Endometriosis Mother   . Migraines Mother   . Miscarriages / [place] Mother   . Cancer Maternal Aunt        renal  . Cancer Maternal Uncle        breast  . Mental illness Maternal Grandmother        bipolar, paranoid schizophrenia  . COPD Maternal Grandmother   . Depression Maternal Grandmother   . Heart disease Maternal Grandmother   . Hypertension Maternal Grandmother   . Hyperthyroidism Maternal Grandmother   . Cancer Maternal Grandmother        liver  . Migraines Maternal Grandmother   . Miscarriages / Stillbirths Maternal Grandmother   . Heart disease Paternal Grandmother   . Heart disease Paternal Grandfather   .  Heart attack Paternal Grandfather   . Stroke Paternal Grandfather   . Heart disease Father   . Obesity Father   . Hypertension Father     Social History Social History   Tobacco Use  . Smoking status: Current Every Day Smoker    Packs/day: 0.50    Years: 1.00    Pack years: 0.50    Types: Cigarettes    Last attempt to quit: 02/11/2019    Years since quitting: 1.0  . Smokeless tobacco: Never Used  Substance Use Topics  . Alcohol use: No  . Drug use: Yes    Types: Marijuana    Review of Systems  Constitutional: No fever/chills  ENT: As above  Respiratory: As  above Gastrointestinal: No abdominal pain, positive nausea Genitourinary: Negative for dysuria. Musculoskeletal: Myalgias Skin: Negative for rash. Neurological: Intermittent headaches    ____________________________________________   PHYSICAL EXAM:  VITAL SIGNS: ED Triage Vitals  Enc Vitals Group     BP 02/12/20 2337 125/72     Pulse Rate 02/12/20 2337 85     Resp 02/12/20 2337 18     Temp 02/12/20 2337 98.4 F (36.9 C)     Temp Source 02/12/20 2337 Oral     SpO2 02/12/20 2337 98 %     Weight 02/12/20 2338 62.6 kg (138 lb)     Height 02/12/20 2338 1.6 m (5\' 3" )     Head Circumference --      Peak Flow --      Pain Score 02/12/20 2338 7     Pain Loc --      Pain Edu? --      Excl. in GC? --      Constitutional: Alert and oriented. Eyes: Conjunctivae are normal.  Head: Atraumatic. Nose: No congestion/rhinnorhea. Mouth/Throat: Mucous membranes are moist.   Cardiovascular: Normal rate, regular rhythm.  Respiratory: Normal respiratory effort.  No retractions.  Musculoskeletal: No lower extremity tenderness nor edema.   Neurologic:  Normal speech and language. No gross focal neurologic deficits are appreciated.   Skin:  Skin is warm, dry and intact. No rash noted.   ____________________________________________   LABS (all labs ordered are listed, but only abnormal results are displayed)  Labs Reviewed  COMPREHENSIVE METABOLIC PANEL - Abnormal; Notable for the following components:      Result Value   CO2 21 (*)    Glucose, Bld 106 (*)    Calcium 8.7 (*)    All other components within normal limits  CBC - Abnormal; Notable for the following components:   Hemoglobin 11.1 (*)    HCT 34.8 (*)    All other components within normal limits  URINALYSIS, COMPLETE (UACMP) WITH MICROSCOPIC - Abnormal; Notable for the following components:   Color, Urine YELLOW (*)    APPearance HAZY (*)    Bacteria, UA RARE (*)    All other components within normal limits  NOVEL  CORONAVIRUS, NAA (HOSP ORDER, SEND-OUT TO REF LAB; TAT 18-24 HRS)  LIPASE, BLOOD  POCT PREGNANCY, URINE  POC SARS CORONAVIRUS 2 AG -  ED  POC SARS CORONAVIRUS 2 AG  POC URINE PREG, ED   ____________________________________________  EKG   ____________________________________________  RADIOLOGY  None ____________________________________________   PROCEDURES  Procedure(s) performed: No  Procedures   Critical Care performed: No ____________________________________________   INITIAL IMPRESSION / ASSESSMENT AND PLAN / ED COURSE  Pertinent labs & imaging results that were available during my care of the patient were reviewed by me and considered  in my medical decision making (see chart for details).  Patient overall well-appearing in no acute distress, rapid coronavirus test is negative, will send PCR.  Lab work is reassuring.  Symptoms are most consistent with viral illness, recommend supportive care.   ____________________________________________   FINAL CLINICAL IMPRESSION(S) / ED DIAGNOSES  Final diagnoses:  Viral illness      NEW MEDICATIONS STARTED DURING THIS VISIT:  Discharge Medication List as of 02/13/2020  2:52 AM       Note:  This document was prepared using Dragon voice recognition software and may include unintentional dictation errors.   Jene Every, MD 02/13/20 [id number]

## 2020-02-14 LAB — NOVEL CORONAVIRUS, NAA (HOSP ORDER, SEND-OUT TO REF LAB; TAT 18-24 HRS): SARS-CoV-2, NAA: NOT DETECTED

## 2020-08-04 DIAGNOSIS — M79671 Pain in right foot: Secondary | ICD-10-CM | POA: Diagnosis not present

## 2020-09-07 DIAGNOSIS — Z20822 Contact with and (suspected) exposure to covid-19: Secondary | ICD-10-CM | POA: Diagnosis not present

## 2020-12-08 DIAGNOSIS — Z20822 Contact with and (suspected) exposure to covid-19: Secondary | ICD-10-CM | POA: Diagnosis not present

## 2020-12-15 DIAGNOSIS — J40 Bronchitis, not specified as acute or chronic: Secondary | ICD-10-CM | POA: Diagnosis not present

## 2020-12-15 DIAGNOSIS — Z03818 Encounter for observation for suspected exposure to other biological agents ruled out: Secondary | ICD-10-CM | POA: Diagnosis not present

## 2021-01-19 ENCOUNTER — Other Ambulatory Visit: Payer: Self-pay

## 2021-01-19 ENCOUNTER — Emergency Department (HOSPITAL_COMMUNITY)
Admission: EM | Admit: 2021-01-19 | Discharge: 2021-01-20 | Disposition: A | Discharge status: Home / Self Care | Payer: Medicaid Other | Attending: Emergency Medicine | Admitting: Emergency Medicine

## 2021-01-19 DIAGNOSIS — Z5321 Procedure and treatment not carried out due to patient leaving prior to being seen by health care provider: Secondary | ICD-10-CM | POA: Diagnosis not present

## 2021-01-19 DIAGNOSIS — K0889 Other specified disorders of teeth and supporting structures: Secondary | ICD-10-CM | POA: Insufficient documentation

## 2021-01-19 NOTE — ED Triage Notes (Signed)
Pt c/o pain and swelling to left lower cheek. Pt has multiple broken teeth and dental carries. Pain began last night.

## 2021-01-20 NOTE — ED Notes (Signed)
Pt called 3x no answer  

## 2021-03-05 DIAGNOSIS — S50811A Abrasion of right forearm, initial encounter: Secondary | ICD-10-CM | POA: Diagnosis not present

## 2021-03-05 DIAGNOSIS — W5503XA Scratched by cat, initial encounter: Secondary | ICD-10-CM | POA: Diagnosis not present

## 2021-05-06 DIAGNOSIS — G8929 Other chronic pain: Secondary | ICD-10-CM | POA: Diagnosis not present

## 2021-05-06 DIAGNOSIS — Z3202 Encounter for pregnancy test, result negative: Secondary | ICD-10-CM | POA: Diagnosis not present

## 2021-05-06 DIAGNOSIS — R1013 Epigastric pain: Secondary | ICD-10-CM | POA: Diagnosis not present

## 2021-08-20 ENCOUNTER — Other Ambulatory Visit: Payer: Self-pay

## 2021-08-20 ENCOUNTER — Emergency Department
Admission: EM | Admit: 2021-08-20 | Discharge: 2021-08-20 | Disposition: A | Discharge status: Home / Self Care | Payer: Medicaid Other | Attending: Emergency Medicine | Admitting: Emergency Medicine

## 2021-08-20 DIAGNOSIS — F17210 Nicotine dependence, cigarettes, uncomplicated: Secondary | ICD-10-CM | POA: Diagnosis not present

## 2021-08-20 DIAGNOSIS — N3000 Acute cystitis without hematuria: Secondary | ICD-10-CM | POA: Insufficient documentation

## 2021-08-20 DIAGNOSIS — R300 Dysuria: Secondary | ICD-10-CM | POA: Diagnosis present

## 2021-08-20 LAB — COMPREHENSIVE METABOLIC PANEL
ALT: 17 U/L (ref 0–44)
AST: 16 U/L (ref 15–41)
Albumin: 3.9 g/dL (ref 3.5–5.0)
Alkaline Phosphatase: 54 U/L (ref 38–126)
Anion gap: 7 (ref 5–15)
BUN: 15 mg/dL (ref 6–20)
CO2: 23 mmol/L (ref 22–32)
Calcium: 8.9 mg/dL (ref 8.9–10.3)
Chloride: 107 mmol/L (ref 98–111)
Creatinine, Ser: 0.64 mg/dL (ref 0.44–1.00)
GFR, Estimated: >60 mL/min (ref >60)
Glucose, Bld: 110 mg/dL — ABNORMAL HIGH (ref 70–99)
Potassium: 3.9 mmol/L (ref 3.5–5.1)
Sodium: 137 mmol/L (ref 135–145)
Total Bilirubin: 0.7 mg/dL (ref 0.3–1.2)
Total Protein: 6.9 g/dL (ref 6.5–8.1)

## 2021-08-20 LAB — URINALYSIS, ROUTINE W REFLEX MICROSCOPIC
Bacteria, UA: NONE SEEN
Bilirubin Urine: NEGATIVE
Glucose, UA: NEGATIVE mg/dL
Hgb urine dipstick: NEGATIVE
Ketones, ur: NEGATIVE mg/dL
Nitrite: NEGATIVE
Protein, ur: 30 mg/dL — AB
Specific Gravity, Urine: 1.016 (ref 1.005–1.030)
WBC, UA: >50 WBC/hpf — ABNORMAL HIGH (ref 0–5)
pH: 5 (ref 5.0–8.0)

## 2021-08-20 LAB — CBC
HCT: 33.6 % — ABNORMAL LOW (ref 36.0–46.0)
Hemoglobin: 11.7 g/dL — ABNORMAL LOW (ref 12.0–15.0)
MCH: 31.4 pg (ref 26.0–34.0)
MCHC: 34.8 g/dL (ref 30.0–36.0)
MCV: 90.1 fL (ref 80.0–100.0)
Platelets: 290 10*3/uL (ref 150–400)
RBC: 3.73 MIL/uL — ABNORMAL LOW (ref 3.87–5.11)
RDW: 12.2 % (ref 11.5–15.5)
WBC: 8.2 10*3/uL (ref 4.0–10.5)
nRBC: 0 % (ref 0.0–0.2)

## 2021-08-20 LAB — POC URINE PREG, ED: Preg Test, Ur: NEGATIVE

## 2021-08-20 LAB — LIPASE, BLOOD: Lipase: 27 U/L (ref 11–51)

## 2021-08-20 MED ORDER — OXYCODONE HCL 5 MG PO TABS
5.0000 mg | ORAL_TABLET | Freq: Once | ORAL | Status: AC
  Administered 2021-08-20: 5 mg via ORAL
  Filled 2021-08-20: qty 1

## 2021-08-20 MED ORDER — KETOROLAC TROMETHAMINE 30 MG/ML IJ SOLN
15.0000 mg | Freq: Once | INTRAMUSCULAR | Status: AC
  Administered 2021-08-20: 15 mg via INTRAVENOUS
  Filled 2021-08-20: qty 1

## 2021-08-20 MED ORDER — LACTATED RINGERS IV BOLUS: 1000.0000 mL | Freq: Once | INTRAVENOUS | Status: DC

## 2021-08-20 MED ORDER — ACETAMINOPHEN 500 MG PO TABS
1000.0000 mg | ORAL_TABLET | Freq: Once | ORAL | Status: AC
  Administered 2021-08-20: 1000 mg via ORAL
  Filled 2021-08-20: qty 2

## 2021-08-20 MED ORDER — SODIUM CHLORIDE 0.9 % IV BOLUS
1000.0000 mL | Freq: Once | INTRAVENOUS | Status: AC
  Administered 2021-08-20: 1000 mL via INTRAVENOUS

## 2021-08-20 MED ORDER — ONDANSETRON 4 MG PO TBDP: 4.0000 mg | ORAL_TABLET | Freq: Three times a day (TID) | ORAL | 0 refills | Status: DC | PRN

## 2021-08-20 MED ORDER — OXYCODONE HCL 5 MG PO TABS: 5.0000 mg | ORAL_TABLET | Freq: Three times a day (TID) | ORAL | 0 refills | Status: DC | PRN

## 2021-08-20 MED ORDER — ONDANSETRON HCL 4 MG/2ML IJ SOLN
4.0000 mg | Freq: Once | INTRAMUSCULAR | Status: AC
  Administered 2021-08-20: 4 mg via INTRAVENOUS
  Filled 2021-08-20: qty 2

## 2021-08-20 MED ORDER — SODIUM CHLORIDE 0.9 % IV SOLN
1.0000 g | Freq: Once | INTRAVENOUS | Status: AC
  Administered 2021-08-20: 1 g via INTRAVENOUS
  Filled 2021-08-20: qty 10

## 2021-08-20 MED ORDER — CEPHALEXIN 500 MG PO CAPS: 500.0000 mg | ORAL_CAPSULE | Freq: Four times a day (QID) | ORAL | 0 refills | Status: AC

## 2021-08-20 NOTE — ED Notes (Signed)
See triage note. Pt has dysuria since 4d and bilateral flank pain since 2d. UA shows UTI.

## 2021-08-20 NOTE — ED Provider Notes (Signed)
[facility] Emergency Department Provider Note ____________________________________________   Event Date/Time   First MD Initiated Contact with Patient 08/20/21 1614     (approximate)  I have reviewed the triage vital signs and the nursing notes.  HISTORY  Chief Complaint Flank Pain   HPI Robin [name] is a 30 y.o. femalewho presents to the ED for evaluation of flank pain and dysuria.  Chart review indicates no relevant history.  Patient presents to the ED from home for evaluation of about 4 days of dysuria and urinary frequency, now with superimposed bilateral flank pain.  She denies any history of nephrolithiasis.  She reports no recent antibiotics.  Denies fevers.  Does report associated nausea and emesis, nausea improving with triage Zofran.  Past Medical History:  Diagnosis Date   ADHD (attention deficit hyperactivity disorder)    Chronic bronchitis    Chronic bronchitis ([facility])    2x a year. last episode 2.5 years ago   Eczema    Headache(784.0)    miagraine   No pertinent past medical history    Normal pregnancy, first 02/04/2012   Ovarian cyst    Pregnancy as incidental finding    Shortness of breath    SVD (spontaneous vaginal delivery) 02/05/2012   SVD (spontaneous vaginal delivery) 08/01/2019    Patient Active Problem List   Diagnosis Date Noted   Normal pregnancy in multigravida 08/01/2019   SVD (spontaneous vaginal delivery) 08/01/2019   Abdominal pain affecting pregnancy 02/18/2019   Active labor at term 02/16/2015   Migraine, unspecified, without mention of intractable migraine without mention of status migrainosus 08/15/2014   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   Abdominal pregnancy with intrauterine pregnancy 08/15/2014   Chronic daily headache 08/15/2014    Past Surgical History:  Procedure Laterality Date   TONSILLECTOMY     TONSILLECTOMY     WISDOM TOOTH EXTRACTION      Prior to Admission medications   Medication Sig  Start Date End Date Taking? Authorizing Provider  cephALEXin (KEFLEX) 500 MG capsule Take 1 capsule (500 mg total) by mouth 4 (four) times daily for 5 days. 08/20/21 08/25/21 Yes Delton Prairie, MD  ondansetron (ZOFRAN ODT) 4 MG disintegrating tablet Take 1 tablet (4 mg total) by mouth every 8 (eight) hours as needed for nausea or vomiting. 08/20/21  Yes Delton Prairie, MD  oxyCODONE (ROXICODONE) 5 MG immediate release tablet Take 1 tablet (5 mg total) by mouth every 8 (eight) hours as needed. 08/20/21 08/20/22 Yes Delton Prairie, MD  Famotidine (PEPCID PO) Take by mouth as needed (heartburn).     [provider]  ibuprofen (ADVIL) 600 MG tablet Take 1 tablet (600 mg total) by mouth every 6 (six) hours as needed. 08/03/19   [name]-Stuckert, Augusto Gamble, MD  metoCLOPramide (REGLAN) 10 MG tablet Take 10 mg by mouth as needed for refractory nausea / vomiting.     [provider]  Prenatal Vit-Fe Fumarate-FA (PRENATAL MULTIVITAMIN) TABS tablet Take 1 tablet by mouth daily at 12 noon. 08/03/19   [name]-Stuckert, Augusto Gamble, MD    Allergies Penicillins  Family History  Problem Relation Age of Onset   Diabetes Mother    Hyperlipidemia Mother    Chronic bronchitis Mother    Endometriosis Mother    Migraines Mother    Miscarriages / [place] Mother    Cancer Maternal Aunt        renal   Cancer Maternal Uncle        breast   Mental illness Maternal Grandmother  bipolar, paranoid schizophrenia   COPD Maternal Grandmother    Depression Maternal Grandmother    Heart disease Maternal Grandmother    Hypertension Maternal Grandmother    Hyperthyroidism Maternal Grandmother    Cancer Maternal Grandmother        liver   Migraines Maternal Grandmother    Miscarriages / Stillbirths Maternal Grandmother    Heart disease Paternal Grandmother    Heart disease Paternal Grandfather    Heart attack Paternal Grandfather    Stroke Paternal Grandfather    Heart disease Father    Obesity Father     Hypertension Father     Social History Social History   Tobacco Use   Smoking status: Every Day    Packs/day: 0.50    Years: 1.00    Pack years: 0.50    Types: Cigarettes    Last attempt to quit: 02/11/2019    Years since quitting: 2.5   Smokeless tobacco: Never  Substance Use Topics   Alcohol use: No   Drug use: Yes    Types: Marijuana    Review of Systems  Constitutional: No fever/chills Eyes: No visual changes. ENT: No sore throat. Cardiovascular: Denies chest pain. Respiratory: Denies shortness of breath. Gastrointestinal: No abdominal pain.  No nausea, no vomiting.  No diarrhea.  No constipation. Genitourinary: Positive for dysuria and increased frequency. Musculoskeletal: Positive for atraumatic bilateral flank pain. Skin: Negative for rash. Neurological: Negative for headaches, focal weakness or numbness.  ____________________________________________   PHYSICAL EXAM:  VITAL SIGNS: Vitals:   08/20/21 1830 08/20/21 1930  BP: (!) 113/59 111/69  Pulse: (!) 52 73  Resp: 16   Temp:    SpO2: 100% 100%     Constitutional: Alert and oriented.  Sitting upright on side of the bed, appears uncomfortable, but no acute distress. Eyes: Conjunctivae are normal. PERRL. EOMI. Head: Atraumatic. Nose: No congestion/rhinnorhea. Mouth/Throat: Mucous membranes are moist.  Oropharynx non-erythematous. Neck: No stridor. No cervical spine tenderness to palpation. Cardiovascular: Normal rate, regular rhythm. Grossly normal heart sounds.  Good peripheral circulation. Respiratory: Normal respiratory effort.  No retractions. Lungs CTAB. Gastrointestinal: Soft , nondistended Mild suprapubic tenderness without peritoneal features.  Upper abdomen is benign.  No CVA tenderness bilaterally. Musculoskeletal: No lower extremity tenderness nor edema.  No joint effusions. No signs of acute trauma. Neurologic:  Normal speech and language. No gross focal neurologic deficits are appreciated.  No gait instability noted. Skin:  Skin is warm, dry and intact. No rash noted. Psychiatric: Mood and affect are normal. Speech and behavior are normal.  ____________________________________________   LABS (all labs ordered are listed, but only abnormal results are displayed)  Labs Reviewed  COMPREHENSIVE METABOLIC PANEL - Abnormal; Notable for the following components:      Result Value   Glucose, Bld 110 (*)    All other components within normal limits  URINALYSIS, ROUTINE W REFLEX MICROSCOPIC - Abnormal; Notable for the following components:   Color, Urine YELLOW (*)    APPearance CLOUDY (*)    Protein, ur 30 (*)    Leukocytes,Ua LARGE (*)    WBC, UA >50 (*)    All other components within normal limits  CBC - Abnormal; Notable for the following components:   RBC 3.73 (*)    Hemoglobin 11.7 (*)    HCT 33.6 (*)    All other components within normal limits  URINE CULTURE  LIPASE, BLOOD  POC URINE PREG, ED   ____________________________________________  12 Lead EKG   ____________________________________________  RADIOLOGY  ED MD interpretation:    Official radiology report(s): No results found.  ____________________________________________   PROCEDURES and INTERVENTIONS  Procedure(s) performed (including Critical Care):  Procedures  Medications  cefTRIAXone (ROCEPHIN) 1 g in sodium chloride 0.9 % 100 mL IVPB (0 g Intravenous Stopped 08/20/21 1720)  sodium chloride 0.9 % bolus 1,000 mL (0 mLs Intravenous Stopped 08/20/21 1859)  ondansetron (ZOFRAN) injection 4 mg (4 mg Intravenous Given 08/20/21 1716)  ketorolac (TORADOL) 30 MG/ML injection 15 mg (15 mg Intravenous Given 08/20/21 1716)  acetaminophen (TYLENOL) tablet 1,000 mg (1,000 mg Oral Given 08/20/21 1933)  oxyCODONE (Oxy IR/ROXICODONE) immediate release tablet 5 mg (5 mg Oral Given 08/20/21 1933)    ____________________________________________   MDM / ED COURSE   30 year old female presents to the ED  with acute cystitis.  No evidence of sepsis.  While she has flank pain, she does not have stigmata of pyelonephritis such as tachycardia, leukocytosis or fever.  No history of nephrolithiasis and her symptoms are inconsistent with ureterolithiasis.  Appears to be uncomplicated acute cystitis.  We will treat accordingly and discharged with return precautions.     ____________________________________________   FINAL CLINICAL IMPRESSION(S) / ED DIAGNOSES  Final diagnoses:  Acute cystitis without hematuria     ED Discharge Orders          Ordered    cephALEXin (KEFLEX) 500 MG capsule  4 times daily        08/20/21 1927    ondansetron (ZOFRAN ODT) 4 MG disintegrating tablet  Every 8 hours PRN        08/20/21 1927    oxyCODONE (ROXICODONE) 5 MG immediate release tablet  Every 8 hours PRN        08/20/21 1927             Robin [name]   Note:  This document was prepared using Dragon voice recognition software and may include unintentional dictation errors.    Delton Prairie, MD 08/20/21 2017

## 2021-08-20 NOTE — ED Notes (Signed)
Dr Katrinka Blazing at bedside with pt.

## 2021-08-20 NOTE — ED Notes (Signed)
Informed EDP of pt pain level 8/10 (unchanged).

## 2021-08-20 NOTE — Discharge Instructions (Addendum)
Please take Tylenol and ibuprofen/Advil for your pain.  It is safe to take them together, or to alternate them every few hours.  Take up to 1000mg  of Tylenol at a time, up to 4 times per day.  Do not take more than 4000 mg of Tylenol in 24 hours.  For ibuprofen, take 400-600 mg, 4-5 times per day.  Take the Keflex antibiotics 4 times daily for the next 5 days to treat the UTI.  Start taking this medication Friday afternoon as the IV antibiotics will last until tomorrow afternoon.  Take twice tomorrow, and start 4 times daily the following day.  Finish all 20 tablets, even if your symptoms are getting better.  Use Zofran as needed for nausea. Use oxycodone as needed for breakthrough or severe pain despite Tylenol/NSAIDs.  Do not drive, work or Friday while taking oxycodone.  Return to the ED with any worsening symptoms despite these measures.

## 2021-08-20 NOTE — ED Triage Notes (Signed)
Pt to ER via POV with complaints of dysuria x4 days and bilateral flank pain x2 days. Denies any fevers at home. Reports foul smelling urine. States flank pain radiates into groin.

## 2021-08-21 ENCOUNTER — Telehealth: Payer: Self-pay

## 2021-08-21 NOTE — Telephone Encounter (Signed)
Transition Care Management Follow-up Telephone Call Date of discharge and from where: 08/20/2021-[facility] How have you been since you were released from the hospital? Patient stated she is feeling better.  Any questions or concerns? No  Items Reviewed: Did the pt receive and understand the discharge instructions provided? Yes  Medications obtained and verified? Yes  Other? No  Any new allergies since your discharge? No  Dietary orders reviewed? N/A Do you have support at home? Yes   Home Care and Equipment/Supplies: Were home health services ordered? not applicable If so, what is the name of the agency? N/A  Has the agency set up a time to come to the patient's home? not applicable Were any new equipment or medical supplies ordered?  No What is the name of the medical supply agency? N/A Were you able to get the supplies/equipment? not applicable Do you have any questions related to the use of the equipment or supplies? No  Functional Questionnaire: (I = Independent and D = Dependent) ADLs: I  Bathing/Dressing- I  Meal Prep- I  Eating- I  Maintaining continence- I  Transferring/Ambulation- I  Managing Meds- I  Follow up appointments reviewed:  PCP Hospital f/u appt confirmed? No   Specialist Hospital f/u appt confirmed? No   Are transportation arrangements needed? No  If their condition worsens, is the pt aware to call PCP or go to the Emergency Dept.? Yes Was the patient provided with contact information for the PCP's office or ED? Yes Was to pt encouraged to call back with questions or concerns? Yes

## 2021-08-23 LAB — URINE CULTURE: Culture: >=100000 — AB

## 2021-12-14 DIAGNOSIS — J101 Influenza due to other identified influenza virus with other respiratory manifestations: Secondary | ICD-10-CM | POA: Diagnosis not present

## 2021-12-14 DIAGNOSIS — Z03818 Encounter for observation for suspected exposure to other biological agents ruled out: Secondary | ICD-10-CM | POA: Diagnosis not present

## 2022-02-18 ENCOUNTER — Ambulatory Visit (HOSPITAL_COMMUNITY)
Admission: EM | Admit: 2022-02-18 | Discharge: 2022-02-18 | Disposition: A | Discharge status: Home / Self Care | Payer: Medicaid Other | Attending: Family Medicine | Admitting: Family Medicine

## 2022-02-18 ENCOUNTER — Encounter (HOSPITAL_COMMUNITY): Payer: Self-pay

## 2022-02-18 DIAGNOSIS — R059 Cough, unspecified: Secondary | ICD-10-CM | POA: Insufficient documentation

## 2022-02-18 DIAGNOSIS — J069 Acute upper respiratory infection, unspecified: Secondary | ICD-10-CM

## 2022-02-18 DIAGNOSIS — J029 Acute pharyngitis, unspecified: Secondary | ICD-10-CM | POA: Insufficient documentation

## 2022-02-18 DIAGNOSIS — Z20822 Contact with and (suspected) exposure to covid-19: Secondary | ICD-10-CM | POA: Diagnosis not present

## 2022-02-18 LAB — SARS CORONAVIRUS 2 (TAT 6-24 HRS): SARS Coronavirus 2: NEGATIVE

## 2022-02-18 LAB — POCT RAPID STREP A, ED / UC: Streptococcus, Group A Screen (Direct): NEGATIVE

## 2022-02-18 NOTE — ED Provider Notes (Addendum)
[facility]    CSN: [id number] Arrival date & time: 02/18/22  1340      History   Chief Complaint No chief complaint on file.   HPI Robin [name] is a 31 y.o. female.   HPI Here for history of sore throat and headache since this morning.  She also has had some rhinorrhea and some cough.  No fever or chills.  No nausea, vomiting, or diarrhea.  She also mentioned that she has had some chronic problems with headache and some cold feeling of the back of her head that has been going on for some time.  She does not have a PCP    Past Medical History:  Diagnosis Date   ADHD (attention deficit hyperactivity disorder)    Chronic bronchitis    Chronic bronchitis (HCC)    2x a year. last episode 2.5 years ago   Eczema    Headache(784.0)    miagraine   No pertinent past medical history    Normal pregnancy, first 02/04/2012   Ovarian cyst    Pregnancy as incidental finding    Shortness of breath    SVD (spontaneous vaginal delivery) 02/05/2012   SVD (spontaneous vaginal delivery) 08/01/2019    Patient Active Problem List   Diagnosis Date Noted   Normal pregnancy in multigravida 08/01/2019   SVD (spontaneous vaginal delivery) 08/01/2019   Abdominal pain affecting pregnancy 02/18/2019   Active labor at term 02/16/2015   Migraine, unspecified, without mention of intractable migraine without mention of status migrainosus 08/15/2014   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   Abdominal pregnancy with intrauterine pregnancy 08/15/2014   Chronic daily headache 08/15/2014    Past Surgical History:  Procedure Laterality Date   TONSILLECTOMY     TONSILLECTOMY     WISDOM TOOTH EXTRACTION      OB History     Gravida  3   Para  3   Term  3   Preterm  0   AB  0   Living  3      SAB  0   IAB  0   Ectopic  0   Multiple  0   Live Births  3            Home Medications    Prior to Admission medications   Not on File    Family History Family  History  Problem Relation Age of Onset   Diabetes Mother    Hyperlipidemia Mother    Chronic bronchitis Mother    Endometriosis Mother    Migraines Mother    Miscarriages / [place] Mother    Cancer Maternal Aunt        renal   Cancer Maternal Uncle        breast   Mental illness Maternal Grandmother        bipolar, paranoid schizophrenia   COPD Maternal Grandmother    Depression Maternal Grandmother    Heart disease Maternal Grandmother    Hypertension Maternal Grandmother    Hyperthyroidism Maternal Grandmother    Cancer Maternal Grandmother        liver   Migraines Maternal Grandmother    Miscarriages / Stillbirths Maternal Grandmother    Heart disease Paternal Grandmother    Heart disease Paternal Grandfather    Heart attack Paternal Grandfather    Stroke Paternal Grandfather    Heart disease Father    Obesity Father    Hypertension Father     Social History Social History  Tobacco Use   Smoking status: Every Day    Packs/day: 0.50    Years: 1.00    Pack years: 0.50    Types: Cigarettes    Last attempt to quit: 02/11/2019    Years since quitting: 3.0   Smokeless tobacco: Never  Substance Use Topics   Alcohol use: No   Drug use: Yes    Types: Marijuana     Allergies   Penicillins   Review of Systems Review of Systems   Physical Exam Triage Vital Signs ED Triage Vitals [02/18/22 1440]  Enc Vitals Group     BP 124/72     Pulse Rate 84     Resp 16     Temp 97.9 F (36.6 C)     Temp Source Oral     SpO2 98 %     Weight      Height      Head Circumference      Peak Flow      Pain Score      Pain Loc      Pain Edu?      Excl. in GC?    No data found.  Updated Vital Signs BP 124/72 (BP Location: Left Arm)    Pulse 84    Temp 97.9 F (36.6 C) (Oral)    Resp 16    SpO2 98%   Visual Acuity Right Eye Distance:   Left Eye Distance:   Bilateral Distance:    Right Eye Near:   Left Eye Near:    Bilateral Near:     Physical  Exam Vitals reviewed.  Constitutional:      General: She is not in acute distress.    Appearance: She is not toxic-appearing.  HENT:     Right Ear: Tympanic membrane and ear canal normal.     Left Ear: Tympanic membrane and ear canal normal.     Nose: Nose normal.     Mouth/Throat:     Mouth: Mucous membranes are moist.     Pharynx: Posterior oropharyngeal erythema (mild) present. No oropharyngeal exudate.  Eyes:     Extraocular Movements: Extraocular movements intact.     Conjunctiva/sclera: Conjunctivae normal.     Pupils: Pupils are equal, round, and reactive to light.  Cardiovascular:     Rate and Rhythm: Normal rate and regular rhythm.     Heart sounds: No murmur heard. Pulmonary:     Effort: Pulmonary effort is normal. No respiratory distress.     Breath sounds: No wheezing, rhonchi or rales.  Chest:     Chest wall: No tenderness.  Musculoskeletal:     Cervical back: Neck supple.  Lymphadenopathy:     Cervical: No cervical adenopathy.  Skin:    Capillary Refill: Capillary refill takes less than 2 seconds.     Coloration: Skin is not jaundiced or pale.  Neurological:     General: No focal deficit present.     Mental Status: She is alert and oriented to person, place, and time.  Psychiatric:        Behavior: Behavior normal.     UC Treatments / Results  Labs (all labs ordered are listed, but only abnormal results are displayed) Labs Reviewed  POCT RAPID STREP A, ED / UC    EKG   Radiology No results found.  Procedures Procedures (including critical care time)  Medications Ordered in UC Medications - No data to display  Initial Impression / Assessment and Plan / UC Course  I have reviewed the triage vital signs and the nursing notes.  Pertinent labs & imaging results that were available during my care of the patient were reviewed by me and considered in my medical decision making (see chart for details).     Strep test is negative.  Throat culture  is sent.  Swab for COVID today, so she can know if she needs to quarantine  Request made for assistance finding her a PCP Final Clinical Impressions(s) / UC Diagnoses   Final diagnoses:  Viral URI  Sore throat     Discharge Instructions      The strep test was negative.  Throat culture is sent to verify that you do not need antibiotics  You can take DayQuil or NyQuil as you needed for the cold symptoms.   You have been swabbed for COVID, and the test will result in the next 24 hours. Our staff will call you if positive. If the test is positive, you should quarantine for 5 days.      ED Prescriptions   None    PDMP not reviewed this encounter.   Zenia Resides, MD 02/18/22 1519    Zenia Resides, MD 02/18/22 1520

## 2022-02-18 NOTE — Discharge Instructions (Addendum)
The strep test was negative.  Throat culture is sent to verify that you do not need antibiotics  You can take DayQuil or NyQuil as you needed for the cold symptoms.   You have been swabbed for COVID, and the test will result in the next 24 hours. Our staff will call you if positive. If the test is positive, you should quarantine for 5 days.

## 2022-02-18 NOTE — ED Triage Notes (Signed)
Pt reports headache and sore throat x 1 day. Pt reports having a cold sensation from the back of her neck down to her spine.

## 2022-02-20 LAB — CULTURE, GROUP A STREP (THRC)

## 2022-02-23 ENCOUNTER — Encounter (HOSPITAL_COMMUNITY): Payer: Self-pay | Admitting: Emergency Medicine

## 2022-02-23 ENCOUNTER — Emergency Department (HOSPITAL_COMMUNITY): Payer: Medicaid Other

## 2022-02-23 ENCOUNTER — Emergency Department (HOSPITAL_COMMUNITY)
Admission: EM | Admit: 2022-02-23 | Discharge: 2022-02-23 | Disposition: A | Discharge status: Home / Self Care | Payer: Medicaid Other | Attending: Emergency Medicine | Admitting: Emergency Medicine

## 2022-02-23 DIAGNOSIS — R102 Pelvic and perineal pain: Secondary | ICD-10-CM | POA: Insufficient documentation

## 2022-02-23 DIAGNOSIS — N939 Abnormal uterine and vaginal bleeding, unspecified: Secondary | ICD-10-CM

## 2022-02-23 DIAGNOSIS — N9489 Other specified conditions associated with female genital organs and menstrual cycle: Secondary | ICD-10-CM | POA: Diagnosis not present

## 2022-02-23 LAB — CBC WITH DIFFERENTIAL/PLATELET
Abs Immature Granulocytes: 0 10*3/uL (ref 0.00–0.07)
Basophils Absolute: 0 10*3/uL (ref 0.0–0.1)
Basophils Relative: 0 %
Eosinophils Absolute: 0.1 10*3/uL (ref 0.0–0.5)
Eosinophils Relative: 1 %
HCT: 36.7 % (ref 36.0–46.0)
Hemoglobin: 12.3 g/dL (ref 12.0–15.0)
Lymphocytes Relative: 25 %
Lymphs Abs: 2 10*3/uL (ref 0.7–4.0)
MCH: 31 pg (ref 26.0–34.0)
MCHC: 33.5 g/dL (ref 30.0–36.0)
MCV: 92.4 fL (ref 80.0–100.0)
Monocytes Absolute: 0.1 10*3/uL (ref 0.1–1.0)
Monocytes Relative: 1 %
Neutro Abs: 5.7 10*3/uL (ref 1.7–7.7)
Neutrophils Relative %: 73 %
Platelets: 315 10*3/uL (ref 150–400)
RBC: 3.97 MIL/uL (ref 3.87–5.11)
RDW: 12.4 % (ref 11.5–15.5)
WBC: 7.8 10*3/uL (ref 4.0–10.5)
nRBC: 0 % (ref 0.0–0.2)
nRBC: 0 /100 WBC

## 2022-02-23 LAB — BASIC METABOLIC PANEL
Anion gap: 9 (ref 5–15)
BUN: 10 mg/dL (ref 6–20)
CO2: 20 mmol/L — ABNORMAL LOW (ref 22–32)
Calcium: 8.9 mg/dL (ref 8.9–10.3)
Chloride: 109 mmol/L (ref 98–111)
Creatinine, Ser: 0.67 mg/dL (ref 0.44–1.00)
GFR, Estimated: >60 mL/min (ref >60)
Glucose, Bld: 102 mg/dL — ABNORMAL HIGH (ref 70–99)
Potassium: 3.9 mmol/L (ref 3.5–5.1)
Sodium: 138 mmol/L (ref 135–145)

## 2022-02-23 LAB — WET PREP, GENITAL
Clue Cells Wet Prep HPF POC: NONE SEEN
Sperm: NONE SEEN
Trich, Wet Prep: NONE SEEN
WBC, Wet Prep HPF POC: <10 (ref <10)
Yeast Wet Prep HPF POC: NONE SEEN

## 2022-02-23 LAB — URINALYSIS, ROUTINE W REFLEX MICROSCOPIC
Bilirubin Urine: NEGATIVE
Glucose, UA: NEGATIVE mg/dL
Ketones, ur: NEGATIVE mg/dL
Leukocytes,Ua: NEGATIVE
Nitrite: NEGATIVE
Protein, ur: NEGATIVE mg/dL
Specific Gravity, Urine: 1.015 (ref 1.005–1.030)
pH: 6 (ref 5.0–8.0)

## 2022-02-23 LAB — RPR: RPR Ser Ql: NONREACTIVE

## 2022-02-23 LAB — I-STAT BETA HCG BLOOD, ED (MC, WL, AP ONLY): I-stat hCG, quantitative: <5 m[IU]/mL (ref <5)

## 2022-02-23 LAB — HIV ANTIBODY (ROUTINE TESTING W REFLEX): HIV Screen 4th Generation wRfx: NONREACTIVE

## 2022-02-23 MED ORDER — OXYCODONE-ACETAMINOPHEN 5-325 MG PO TABS
1.0000 | ORAL_TABLET | Freq: Once | ORAL | Status: AC
  Administered 2022-02-23: 1 via ORAL
  Filled 2022-02-23: qty 1

## 2022-02-23 MED ORDER — IBUPROFEN 600 MG PO TABS
600.0000 mg | ORAL_TABLET | Freq: Three times a day (TID) | ORAL | 0 refills | Status: AC | PRN
Start: 2022-02-23 — End: ?

## 2022-02-23 MED ORDER — KETOROLAC TROMETHAMINE 30 MG/ML IJ SOLN
15.0000 mg | Freq: Once | INTRAMUSCULAR | Status: AC
  Administered 2022-02-23: 15 mg via INTRAVENOUS
  Filled 2022-02-23: qty 1

## 2022-02-23 MED ORDER — OXYCODONE-ACETAMINOPHEN 5-325 MG PO TABS: 1.0000 | ORAL_TABLET | Freq: Three times a day (TID) | ORAL | 0 refills | Status: DC | PRN

## 2022-02-23 NOTE — ED Provider Triage Note (Signed)
Emergency Medicine Provider Triage Evaluation Note  Robin [name] , a 31 y.o. female  was evaluated in triage.  Pt complains of abdominal/pelvic pain and vaginal bleeding. She reports she started her period on 2/15 and it hasn't stopped since. She has gone threw a whole box of tampons in 2 days. One episode of emesis.   Review of Systems  Positive: Vaginal bleeding, abdominal pain Negative: Fevers, chills  Physical Exam  BP (!) 148/81 (BP Location: Right Arm)    Pulse (!) 101    Temp 98.8 F (37.1 C) (Oral)    Resp 16    LMP 02/10/2022    SpO2 100%  Gen:   Awake, no distress   Resp:  Normal effort  MSK:   Moves extremities without difficulty  Other:    Medical Decision Making  Medically screening exam initiated at 9:52 AM.  Appropriate orders placed.  Robin [name] was informed that the remainder of the evaluation will be completed by another provider, this initial triage assessment does not replace that evaluation, and the importance of remaining in the ED until their evaluation is complete.     Su Monks, [place] 02/23/22 [phone]

## 2022-02-23 NOTE — ED Triage Notes (Signed)
Pt. Stated, Im having vaginal bleeding for a week and half with pain in my stomach and back.

## 2022-02-23 NOTE — Discharge Instructions (Addendum)
The medicines hopefully can help with the bleeding some.  Also the pain.  Follow-up with GYN.  Return if you are feeling more lightheaded.

## 2022-02-23 NOTE — ED Provider Notes (Signed)
[facility] EMERGENCY DEPARTMENT Provider Note   CSN: [id number] Arrival date & time: 02/23/22  6384     History  Chief Complaint  Patient presents with   Vaginal Bleeding   Abdominal Pain   Abdominal Cramping   Chest Pain    Robin [name] is a 31 y.o. female.   Vaginal Bleeding Associated symptoms: abdominal pain   Abdominal Pain Associated symptoms: chest pain and vaginal bleeding   Abdominal Cramping Associated symptoms include chest pain and abdominal pain.  Chest Pain Associated symptoms: abdominal pain   Associated symptoms: no weakness   Patient presents with abdominal pain and vaginal bleeding.  States her menses started about 2 weeks ago.  States it was earlier than normal.  States she is continued to bleed.  States she is passing clots every time she checks.  States she is going through a whole box of tampons in 2 days.  Pain in her pelvis.  Does not have a history of heavy bleeding.  States she is on birth control implant and is due to be changed in September.  Slight chest pain at times also.    Home Medications Prior to Admission medications   Medication Sig Start Date End Date Taking? Authorizing Provider  ibuprofen (ADVIL) 600 MG tablet Take 1 tablet (600 mg total) by mouth every 8 (eight) hours as needed for moderate pain. 02/23/22  Yes Benjiman Core, MD  oxyCODONE-acetaminophen (PERCOCET/ROXICET) 5-325 MG tablet Take 1-2 tablets by mouth every 8 (eight) hours as needed for severe pain. 02/23/22  Yes Benjiman Core, MD      Allergies    Penicillins    Review of Systems   Review of Systems  HENT:  Negative for congestion.   Cardiovascular:  Positive for chest pain.  Gastrointestinal:  Positive for abdominal pain.  Genitourinary:  Positive for vaginal bleeding.  Neurological:  Negative for weakness.   Physical Exam Updated Vital Signs BP 107/62 (BP Location: Right Arm)    Pulse 68    Temp 98.9 F (37.2 C) (Oral)    Resp 18     LMP 02/10/2022    SpO2 100%  Physical Exam Vitals and nursing note reviewed.  HENT:     Head: Atraumatic.  Cardiovascular:     Rate and Rhythm: Normal rate.  Pulmonary:     Effort: Pulmonary effort is normal.  Abdominal:     Tenderness: There is abdominal tenderness.     Comments: Mild suprapubic tenderness out rebound or guarding.  No hernia palpated.  Skin:    General: Skin is warm.  Neurological:     Mental Status: She is alert.    ED Results / Procedures / Treatments   Labs (all labs ordered are listed, but only abnormal results are displayed) Labs Reviewed  BASIC METABOLIC PANEL - Abnormal; Notable for the following components:      Result Value   CO2 20 (*)    Glucose, Bld 102 (*)    All other components within normal limits  URINALYSIS, ROUTINE W REFLEX MICROSCOPIC - Abnormal; Notable for the following components:   Hgb urine dipstick MODERATE (*)    Bacteria, UA RARE (*)    All other components within normal limits  WET PREP, GENITAL  CBC WITH DIFFERENTIAL/PLATELET  RPR  HIV ANTIBODY (ROUTINE TESTING W REFLEX)  I-STAT BETA HCG BLOOD, ED ([facility], [facility], [facility] ONLY)  GC/CHLAMYDIA PROBE AMP ([place]) NOT AT [facility]    EKG None  Radiology US PELVIC COMPLETE W  TRANSVAGINAL AND TORSION R/O  Result Date: 02/23/2022 CLINICAL DATA:  Vaginal bleeding EXAM: TRANSABDOMINAL AND TRANSVAGINAL ULTRASOUND OF PELVIS DOPPLER ULTRASOUND OF OVARIES TECHNIQUE: Both transabdominal and transvaginal ultrasound examinations of the pelvis were performed. Transabdominal technique was performed for global imaging of the pelvis including uterus, ovaries, adnexal regions, and pelvic cul-de-sac. It was necessary to proceed with endovaginal exam following the transabdominal exam to visualize the endometrium and ovaries. Color and duplex Doppler ultrasound was utilized to evaluate blood flow to the ovaries. COMPARISON:  CT done on 09/29/2016 FINDINGS: Uterus Measurements: 9.1 x 4.3 x 5.3 cm = volume: 108.6  mL. No fibroids or other mass visualized. Endometrium Thickness: 4 mm.  No focal abnormality visualized. Right ovary Measurements: 3.2 x 1.7 x 2.2 cm = volume: 6.1 mL. Normal appearance/no adnexal mass. Left ovary Measurements: 2.7 x 2.4 x 2 cm = volume: 6.8 mL. Normal appearance/no adnexal mass. Pulsed Doppler evaluation of both ovaries demonstrates normal low-resistance arterial and venous waveforms. Other findings No abnormal free fluid. IMPRESSION: No sonographic abnormality is seen in pelvis. Electronically Signed   By: Ernie Avena M.D.   On: 02/23/2022 12:19    Procedures Procedures    Medications Ordered in ED Medications  oxyCODONE-acetaminophen (PERCOCET/ROXICET) 5-325 MG per tablet 1 tablet (1 tablet Oral Given 02/23/22 1002)  ketorolac (TORADOL) 30 MG/ML injection 15 mg (15 mg Intravenous Given 02/23/22 1219)    ED Course/ Medical Decision Making/ A&P                           Medical Decision Making Amount and/or Complexity of Data Reviewed Labs: ordered. Radiology: ordered. ECG/medicine tests: ordered.  Risk Prescription drug management.   Patient presents with vaginal bleeding and pain.  Has had for the last couple weeks.  Increased bleeding.  No lightheadedness or dizziness.  Hemoglobin done and reassuring.  Pelvic exam done and did show some mild vaginal bleeding.  Ultrasound done due to pain and reassuring.  No acute abnormality.  Symptomatically treated.  Not pregnant.  No ectopic pregnancy.  Hemoglobin reassuring.  Outpatient follow-up with GYN.        Final Clinical Impression(s) / ED Diagnoses Final diagnoses:  Vaginal bleeding  Pelvic pain    Rx / DC Orders ED Discharge Orders          Ordered    ibuprofen (ADVIL) 600 MG tablet  Every 8 hours PRN        02/23/22 1351    oxyCODONE-acetaminophen (PERCOCET/ROXICET) 5-325 MG tablet  Every 8 hours PRN        02/23/22 1351              Benjiman Core, MD 02/24/22 [phone]

## 2022-02-24 LAB — GC/CHLAMYDIA PROBE AMP (~~LOC~~) NOT AT ARMC
Chlamydia: NEGATIVE
Comment: NEGATIVE
Comment: NORMAL
Neisseria Gonorrhea: NEGATIVE

## 2022-02-26 DIAGNOSIS — J209 Acute bronchitis, unspecified: Secondary | ICD-10-CM | POA: Diagnosis not present

## 2022-02-26 DIAGNOSIS — B9689 Other specified bacterial agents as the cause of diseases classified elsewhere: Secondary | ICD-10-CM | POA: Diagnosis not present

## 2022-02-26 DIAGNOSIS — J019 Acute sinusitis, unspecified: Secondary | ICD-10-CM | POA: Diagnosis not present

## 2022-03-11 DIAGNOSIS — Z3202 Encounter for pregnancy test, result negative: Secondary | ICD-10-CM | POA: Diagnosis not present

## 2022-03-11 DIAGNOSIS — R053 Chronic cough: Secondary | ICD-10-CM | POA: Diagnosis not present

## 2022-04-18 ENCOUNTER — Emergency Department
Admission: EM | Admit: 2022-04-18 | Discharge: 2022-04-19 | Disposition: A | Discharge status: Home / Self Care | Payer: Medicaid Other | Attending: Emergency Medicine | Admitting: Emergency Medicine

## 2022-04-18 ENCOUNTER — Other Ambulatory Visit: Payer: Self-pay

## 2022-04-18 DIAGNOSIS — R21 Rash and other nonspecific skin eruption: Secondary | ICD-10-CM | POA: Diagnosis present

## 2022-04-18 DIAGNOSIS — R234 Changes in skin texture: Secondary | ICD-10-CM | POA: Diagnosis not present

## 2022-04-18 DIAGNOSIS — R102 Pelvic and perineal pain: Secondary | ICD-10-CM | POA: Diagnosis not present

## 2022-04-18 NOTE — ED Triage Notes (Signed)
Pt with itchy brown dot noted to left upper back. Pt states area is tender to touch.  ?

## 2022-04-18 NOTE — ED Provider Notes (Signed)
? ?[facility] ?Provider Note ? ? ? Event Date/Time  ? First MD Initiated Contact with Patient 04/18/22 2321   ?  (approximate) ? ? ?History  ? ?Rash ? ? ?HPI ? ?Robin [name] is a 31 y.o. female who presents for evaluation of a rash.  Patient reports that yesterday she noticed this black dot on her left upper back.  She was concerned it could be a tick.  She reports it is slightly painful to the touch and very itchy.  No fever or chills. ?  ? ? ?Past Medical History:  ?Diagnosis Date  ? ADHD (attention deficit hyperactivity disorder)   ? Chronic bronchitis   ? Chronic bronchitis (HCC)   ? 2x a year. last episode 2.5 years ago  ? Eczema   ? Headache(784.0)   ? miagraine  ? No pertinent past medical history   ? Normal pregnancy, first 02/04/2012  ? Ovarian cyst   ? Pregnancy as incidental finding   ? Shortness of breath   ? SVD (spontaneous vaginal delivery) 02/05/2012  ? SVD (spontaneous vaginal delivery) 08/01/2019  ? ? ?Past Surgical History:  ?Procedure Laterality Date  ? TONSILLECTOMY    ? TONSILLECTOMY    ? WISDOM TOOTH EXTRACTION    ? ? ? ?Physical Exam  ? ?Triage Vital Signs: ?ED Triage Vitals [04/18/22 2255]  ?Enc Vitals Group  ?   BP 106/75  ?   Pulse Rate 97  ?   Resp 16  ?   Temp 98.3 ?F (36.8 ?C)  ?   Temp Source Oral  ?   SpO2 98 %  ?   Weight 138 lb (62.6 kg)  ?   Height 5\' 3"  (1.6 m)  ?   Head Circumference   ?   Peak Flow   ?   Pain Score   ?   Pain Loc   ?   Pain Edu?   ?   Excl. in GC?   ? ? ?Most recent vital signs: ?Vitals:  ? 04/18/22 2255  ?BP: 106/75  ?Pulse: 97  ?Resp: 16  ?Temp: 98.3 ?F (36.8 ?C)  ?SpO2: 98%  ? ? ? ?Constitutional: Alert and oriented. Well appearing and in no apparent distress. ?HEENT: ?     Head: Normocephalic and atraumatic.    ?     Eyes: Conjunctivae are normal. Sclera is non-icteric.  ?     Mouth/Throat: Mucous membranes are moist.  ?     Neck: Supple with no signs of meningismus. ?Cardiovascular: Regular rate and rhythm. ?Respiratory: Normal respiratory  effort.  ?Musculoskeletal:  No edema, cyanosis, or erythema of extremities. ?Neurologic: Normal speech and language. Face is symmetric. Moving all extremities. No gross focal neurologic deficits are appreciated. ?Skin: Skin is warm, dry and intact. There is a small black dot measuring the size of a pencil tip on her L upper back consistent with a scab ?Psychiatric: Mood and affect are normal. Speech and behavior are normal. ? ?ED Results / Procedures / Treatments  ? ?Labs ?(all labs ordered are listed, but only abnormal results are displayed) ?Labs Reviewed - No data to display ? ? ?EKG ? ?none ? ? ?RADIOLOGY ?none ? ? ?PROCEDURES: ? ?Critical Care performed: No ? ?Procedures ? ? ? ?IMPRESSION / MDM / ASSESSMENT AND PLAN / ED COURSE  ?I reviewed the triage vital signs and the nursing notes. ? ? 31 y.o. female who presents for evaluation of a rash.  Patient points to a small  black dots located in the left upper back that is consistent with a scab.  It was removed by me to ensure no tick was underneath it.  There was no signs of tick or any other insects.  The area was sterilized with chlorhexidine and a Band-Aid was applied.  We discussed wound care at home and follow-up with PCP.  Discussed my standard return precautions for ? ? ?MEDICATIONS GIVEN IN ED: ?Medications - No data to display ? ? ? ? ?FINAL CLINICAL IMPRESSION(S) / ED DIAGNOSES  ? ?Final diagnoses:  ?Scab  ? ? ? ?Rx / DC Orders  ? ?ED Discharge Orders   ? ? None  ? ?  ? ? ? ?Note:  This document was prepared using Dragon voice recognition software and may include unintentional dictation errors. ? ? ?Please note:  Patient was evaluated in Emergency Department today for the symptoms described in the history of present illness. Patient was evaluated in the context of the global COVID-19 pandemic, which necessitated consideration that the patient might be at risk for infection with the SARS-CoV-2 virus that causes COVID-19. Institutional protocols and  algorithms that pertain to the evaluation of patients at risk for COVID-19 are in a state of rapid change based on information released by regulatory bodies including the CDC and federal and state organizations. These policies and algorithms were followed during the patient's care in the ED.  Some ED evaluations and interventions may be delayed as a result of limited staffing during the pandemic. ? ? ? ? ?  ?Nita Sickle, MD ?04/18/22 2346 ? ?

## 2022-04-18 NOTE — Discharge Instructions (Signed)
Keep the area clean. Watch for signs of infection including redness/ warmth of the surrounding skin, pus, or fever. If these develop, return to the ER or go see your doctor ?

## 2022-04-26 ENCOUNTER — Ambulatory Visit (INDEPENDENT_AMBULATORY_CARE_PROVIDER_SITE_OTHER)
Admission: RE | Admit: 2022-04-26 | Discharge: 2022-04-26 | Disposition: A | Discharge status: Home / Self Care | Payer: Medicaid Other | Source: Ambulatory Visit | Attending: Nurse Practitioner | Admitting: Nurse Practitioner

## 2022-04-26 ENCOUNTER — Ambulatory Visit: Payer: Medicaid Other | Admitting: Nurse Practitioner

## 2022-04-26 ENCOUNTER — Encounter: Payer: Self-pay | Admitting: Nurse Practitioner

## 2022-04-26 VITALS — BP 126/62 | HR 72 | Temp 97.7°F | Resp 12 | Ht 63.5 in | Wt 134.5 lb

## 2022-04-26 DIAGNOSIS — M25551 Pain in right hip: Secondary | ICD-10-CM

## 2022-04-26 DIAGNOSIS — R5383 Other fatigue: Secondary | ICD-10-CM | POA: Insufficient documentation

## 2022-04-26 DIAGNOSIS — M25552 Pain in left hip: Secondary | ICD-10-CM | POA: Diagnosis not present

## 2022-04-26 DIAGNOSIS — M25562 Pain in left knee: Secondary | ICD-10-CM | POA: Diagnosis not present

## 2022-04-26 DIAGNOSIS — R413 Other amnesia: Secondary | ICD-10-CM | POA: Diagnosis not present

## 2022-04-26 DIAGNOSIS — G44221 Chronic tension-type headache, intractable: Secondary | ICD-10-CM | POA: Diagnosis not present

## 2022-04-26 DIAGNOSIS — F419 Anxiety disorder, unspecified: Secondary | ICD-10-CM | POA: Insufficient documentation

## 2022-04-26 DIAGNOSIS — F32A Depression, unspecified: Secondary | ICD-10-CM | POA: Insufficient documentation

## 2022-04-26 MED ORDER — CYCLOBENZAPRINE HCL 5 MG PO TABS: 5.0000 mg | ORAL_TABLET | Freq: Two times a day (BID) | ORAL | 0 refills | Status: DC | PRN

## 2022-04-26 MED ORDER — SERTRALINE HCL 25 MG PO TABS: 25.0000 mg | ORAL_TABLET | Freq: Every day | ORAL | 1 refills | Status: DC

## 2022-04-26 NOTE — Assessment & Plan Note (Signed)
Pending labs in office.  Could be multifactorial inclusive of depression. ?
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
## 2022-04-26 NOTE — Assessment & Plan Note (Signed)
Patient has dealt with chronic headaches for extended period time she will try over-the-counter analgesics without great relief.  Was being followed by neurology but never returned after she gave birth to her child which was approximately 7 years ago.  Patient had muscle tightness and occipital tenderness on exam we will try muscle relaxer to see if this is beneficial.  Pending lab results.  Likely need referral to neurology ?

## 2022-04-26 NOTE — Assessment & Plan Note (Signed)
Unsure of etiology.  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  We will check basic labs likely refer to neurology if labs are normal. ?

## 2022-04-26 NOTE — Assessment & Plan Note (Signed)
May be a compensatory pain due to patient's right foot turning out laterally.  Over-the-counter NSAIDs have help with pain.  We will obtain x-ray, pending results. ?

## 2022-04-26 NOTE — Assessment & Plan Note (Signed)
Did administer PHQ-9 and GAD-7 in office.  Patient denies HI/SI/AVH.  Was evaluated by [facility] in the past but decided not to return due to clinic atmosphere.  We will start patient on sertraline 25 mg nightly did review common side effects.  Follow-up in 6 weeks for recheck. ?

## 2022-04-26 NOTE — Patient Instructions (Signed)
Nice to see you today ?I sent in zoloft and flexeril. The muscle relaxer can cause you to be sleepy so be careful ?I will be in touch with the labs and xray results once I have them ?Follow up with me in 6 weeks, sooner if you need me ?

## 2022-04-27 ENCOUNTER — Telehealth: Payer: Self-pay | Admitting: Nurse Practitioner

## 2022-04-27 LAB — COMPREHENSIVE METABOLIC PANEL
ALT: 10 U/L (ref 0–35)
AST: 13 U/L (ref 0–37)
Albumin: 4.5 g/dL (ref 3.5–5.2)
Alkaline Phosphatase: 57 U/L (ref 39–117)
BUN: 10 mg/dL (ref 6–23)
CO2: 27 mEq/L (ref 19–32)
Calcium: 9.5 mg/dL (ref 8.4–10.5)
Chloride: 104 mEq/L (ref 96–112)
Creatinine, Ser: 0.77 mg/dL (ref 0.40–1.20)
GFR: 105.03 mL/min (ref >60.00)
Glucose, Bld: 83 mg/dL (ref 70–99)
Potassium: 4 mEq/L (ref 3.5–5.1)
Sodium: 138 mEq/L (ref 135–145)
Total Bilirubin: 0.3 mg/dL (ref 0.2–1.2)
Total Protein: 6.9 g/dL (ref 6.0–8.3)

## 2022-04-27 LAB — CBC WITH DIFFERENTIAL/PLATELET
Basophils Absolute: 0.1 10*3/uL (ref 0.0–0.1)
Basophils Relative: 0.7 % (ref 0.0–3.0)
Eosinophils Absolute: 0.2 10*3/uL (ref 0.0–0.7)
Eosinophils Relative: 2.4 % (ref 0.0–5.0)
HCT: 36.5 % (ref 36.0–46.0)
Hemoglobin: 12.2 g/dL (ref 12.0–15.0)
Lymphocytes Relative: 39.5 % (ref 12.0–46.0)
Lymphs Abs: 2.9 10*3/uL (ref 0.7–4.0)
MCHC: 33.3 g/dL (ref 30.0–36.0)
MCV: 90.5 fl (ref 78.0–100.0)
Monocytes Absolute: 0.3 10*3/uL (ref 0.1–1.0)
Monocytes Relative: 4 % (ref 3.0–12.0)
Neutro Abs: 3.9 10*3/uL (ref 1.4–7.7)
Neutrophils Relative %: 53.4 % (ref 43.0–77.0)
Platelets: 307 10*3/uL (ref 150.0–400.0)
RBC: 4.03 Mil/uL (ref 3.87–5.11)
RDW: 13.1 % (ref 11.5–15.5)
WBC: 7.3 10*3/uL (ref 4.0–10.5)

## 2022-04-27 LAB — TSH: TSH: 1.68 u[IU]/mL (ref 0.35–5.50)

## 2022-04-27 LAB — IBC + FERRITIN
Ferritin: 7.9 ng/mL — ABNORMAL LOW (ref 10.0–291.0)
Iron: 57 ug/dL (ref 42–145)
Saturation Ratios: 14.5 % — ABNORMAL LOW (ref 20.0–50.0)
TIBC: 393.4 ug/dL (ref 250.0–450.0)
Transferrin: 281 mg/dL (ref 212.0–360.0)

## 2022-04-27 LAB — VITAMIN D 25 HYDROXY (VIT D DEFICIENCY, FRACTURES): VITD: 25.75 ng/mL — ABNORMAL LOW (ref 30.00–100.00)

## 2022-04-27 LAB — VITAMIN B12: Vitamin B-12: 578 pg/mL (ref 211–911)

## 2022-04-27 NOTE — Telephone Encounter (Signed)
Pt called in would like to know Xray results . Would like a call back stated she is still having pain #[phone]  ?

## 2022-04-27 NOTE — Telephone Encounter (Signed)
Called patient but voicemail is full, results are not in yet, can take 2 to 3 days to get results in. We will be in touch once we review this. ?

## 2022-04-29 ENCOUNTER — Encounter: Payer: Medicaid Other | Admitting: Family Medicine

## 2022-04-29 ENCOUNTER — Telehealth: Payer: Self-pay | Admitting: Nurse Practitioner

## 2022-04-29 ENCOUNTER — Encounter: Payer: Self-pay | Admitting: Family Medicine

## 2022-04-29 DIAGNOSIS — M25552 Pain in left hip: Secondary | ICD-10-CM

## 2022-04-29 NOTE — Progress Notes (Signed)
Patient did not keep appointment today. She may call to reschedule.  

## 2022-04-29 NOTE — Telephone Encounter (Signed)
Pt wanted to let Robin [name] know that she reviewed her results and "will do whatever he recommends to help her hip." Pt stated that the pain has "moved up to her back (lower back area)."  ? ?Callback Number: [phone] ?

## 2022-04-29 NOTE — Telephone Encounter (Signed)
Patient advised. Patient advised it can take a few days to hear back about referral details ?

## 2022-04-29 NOTE — Telephone Encounter (Signed)
Referral placed for ortho in [place] ?

## 2022-05-14 ENCOUNTER — Encounter: Payer: Self-pay | Admitting: *Deleted

## 2022-06-07 ENCOUNTER — Encounter: Payer: Self-pay | Admitting: Obstetrics and Gynecology

## 2022-08-12 ENCOUNTER — Ambulatory Visit (LOCAL_COMMUNITY_HEALTH_CENTER): Payer: Medicaid Other | Admitting: Nurse Practitioner

## 2022-08-12 ENCOUNTER — Encounter: Payer: Self-pay | Admitting: Nurse Practitioner

## 2022-08-12 ENCOUNTER — Ambulatory Visit: Payer: Self-pay

## 2022-08-12 VITALS — BP 119/71 | Ht 63.5 in | Wt 141.0 lb

## 2022-08-12 DIAGNOSIS — Z0389 Encounter for observation for other suspected diseases and conditions ruled out: Secondary | ICD-10-CM | POA: Diagnosis not present

## 2022-08-12 DIAGNOSIS — Z113 Encounter for screening for infections with a predominantly sexual mode of transmission: Secondary | ICD-10-CM

## 2022-08-12 DIAGNOSIS — Z01419 Encounter for gynecological examination (general) (routine) without abnormal findings: Secondary | ICD-10-CM | POA: Diagnosis not present

## 2022-08-12 DIAGNOSIS — Z3009 Encounter for other general counseling and advice on contraception: Secondary | ICD-10-CM

## 2022-08-12 DIAGNOSIS — F32A Depression, unspecified: Secondary | ICD-10-CM

## 2022-08-12 LAB — WET PREP FOR TRICH, YEAST, CLUE
Trichomonas Exam: NEGATIVE
Yeast Exam: NEGATIVE

## 2022-08-12 LAB — HM HEPATITIS C SCREENING LAB: HM Hepatitis Screen: NEGATIVE

## 2022-08-12 LAB — HM HIV SCREENING LAB: HM HIV Screening: NEGATIVE

## 2022-08-12 LAB — HEPATITIS B SURFACE ANTIGEN: Hepatitis B Surface Ag: NONREACTIVE

## 2022-08-12 NOTE — Progress Notes (Signed)
WET PREP negative and no treatment indicated in clinic today. Pt verbalized understanding of further labwork pending. Pt was given a FP bag and resources. Lethea Killings RN

## 2022-08-12 NOTE — Progress Notes (Signed)
[facility] Main Number: [phone]    Family Planning Visit- Initial Visit  Subjective:  Robin [name] is a 31 y.o.  [phone]   being seen today for an initial annual visit and to discuss reproductive life planning.  The patient is currently using Hormonal Implant for pregnancy prevention. Patient reports she/her/hers  does not want a pregnancy in the next year.    she/her/hers report they are looking for a method that provides High efficacy at preventing pregnancy  Patient has the following medical conditions has Migraine, unspecified, without mention of intractable migraine without mention of status migrainosus; Analgesic rebound headache; Abdominal pregnancy with intrauterine pregnancy; Chronic daily headache; Active labor at term; Abdominal pain affecting pregnancy; Normal pregnancy in multigravida; SVD (spontaneous vaginal delivery); Memory loss; Left hip pain; Chronic tension-type headache, intractable; Other fatigue; and Anxiety and depression on their problem list.  Chief Complaint  Patient presents with   Annual Exam    PE, PAP and STD check    Patient reports to clinic today for a physical and STD screening.     Body mass index is 24.59 kg/m. - Patient is eligible for diabetes screening based on BMI and age [id number]?  not applicable [id number] ordered? not applicable  Patient reports 1  partner/s in last year. Desires STI screening?  Yes  Has patient been screened once for HCV in the past?  Yes  No results found for: "HCVAB"  Does the patient have current drug use (including MJ), have a partner with drug use, and/or has been incarcerated since last result? Yes  If yes-- Screen for HCV through [facility]   Does the patient meet criteria for HBV testing? Yes  Criteria:  -Household, sexual or needle sharing contact with HBV -History of drug use -HIV positive -Those with known Hep C   Health  Maintenance Due  Topic Date Due   HPV VACCINES (2 - 3-dose series) 04/09/2009   Hepatitis C Screening  Never done   COVID-19 Vaccine (3 - Pfizer series) 11/17/2021   PAP-Cervical Cytology Screening  02/01/2022   PAP SMEAR-Modifier  02/01/2022   INFLUENZA VACCINE  07/27/2022    Review of Systems  Constitutional:  Negative for chills, fever, malaise/fatigue and weight loss.  HENT:  Negative for congestion, hearing loss and sore throat.   Eyes:  Negative for blurred vision, double vision and photophobia.  Respiratory:  Negative for shortness of breath.   Cardiovascular:  Positive for chest pain.  Gastrointestinal:  Positive for nausea. Negative for abdominal pain, blood in stool, constipation, diarrhea, heartburn and vomiting.  Genitourinary:  Negative for dysuria and frequency.  Musculoskeletal:  Negative for back pain, joint pain and neck pain.  Skin:  Negative for itching and rash.  Neurological:  Positive for headaches. Negative for dizziness and weakness.  Endo/Heme/Allergies:  Bruises/bleeds easily.  Psychiatric/Behavioral:  Negative for depression, substance abuse and suicidal ideas.     The following portions of the patient's history were reviewed and updated as appropriate: allergies, current medications, past family history, past medical history, past social history, past surgical history and problem list. Problem list updated.   See flowsheet for other program required questions.  Objective:   Vitals:   08/12/22 1333  BP: 119/71  Weight: 141 lb (64 kg)  Height: 5' 3.5" (1.613 m)    Physical Exam Constitutional:      Appearance: Normal appearance.  HENT:     Head: Normocephalic. No  abrasion, masses or laceration. Hair is normal.     Jaw: No tenderness or swelling.     Right Ear: External ear normal.     Left Ear: External ear normal.     Nose: Nose normal.     Mouth/Throat:     Lips: Pink. No lesions.     Mouth: Mucous membranes are moist. No lacerations or  oral lesions.     Dentition: No dental caries.     Tongue: No lesions.     Palate: No mass and lesions.     Pharynx: No pharyngeal swelling, oropharyngeal exudate, posterior oropharyngeal erythema or uvula swelling.     Tonsils: No tonsillar exudate or tonsillar abscesses.  Eyes:     Pupils: Pupils are equal, round, and reactive to light.  Neck:     Thyroid: No thyroid mass, thyromegaly or thyroid tenderness.  Cardiovascular:     Rate and Rhythm: Normal rate and regular rhythm.  Pulmonary:     Effort: Pulmonary effort is normal.     Breath sounds: Normal breath sounds.  Chest:  Breasts:    Right: Normal. No swelling, mass, nipple discharge, skin change or tenderness.     Left: Normal. No swelling, mass, nipple discharge, skin change or tenderness.  Abdominal:     General: Abdomen is flat. Bowel sounds are normal.     Palpations: Abdomen is soft.     Tenderness: There is no abdominal tenderness. There is no rebound.  Genitourinary:    Pubic Area: No rash or pubic lice.      Labia:        Right: No rash, tenderness or lesion.        Left: No rash, tenderness or lesion.      Vagina: Normal. No vaginal discharge, erythema, tenderness or lesions.     Cervix: No cervical motion tenderness, discharge, lesion or erythema.     Uterus: Normal.      Adnexa:        Right: No tenderness.         Left: No tenderness.       Rectum: Normal.     Comments: Amount Discharge: small  Odor: Yes pH: greater than 4.5 Adheres to vaginal wall: No Color: spotting    Musculoskeletal:     Cervical back: Full passive range of motion without pain and normal range of motion.  Lymphadenopathy:     Cervical: No cervical adenopathy.     Right cervical: No superficial, deep or posterior cervical adenopathy.    Left cervical: No superficial, deep or posterior cervical adenopathy.     Upper Body:     Right upper body: No supraclavicular, axillary or epitrochlear adenopathy.     Left upper body: No  supraclavicular, axillary or epitrochlear adenopathy.     Lower Body: No right inguinal adenopathy. No left inguinal adenopathy.  Skin:    General: Skin is warm and dry.     Findings: No erythema, laceration, lesion or rash.  Neurological:     Mental Status: She is alert and oriented to person, place, and time.  Psychiatric:        Attention and Perception: Attention normal.        Mood and Affect: Mood normal.        Speech: Speech normal.        Behavior: Behavior normal. Behavior is cooperative.       Assessment and Plan:  Robin [name] is a 31 y.o. female presenting to the  [facility] Department for an initial annual wellness/contraceptive visit  Contraception counseling: Reviewed options based on patient desire and reproductive life plan. Patient is interested in continuing Hormonal Implant.   Risks, benefits, and typical effectiveness rates were reviewed.  Questions were answered.  Written information was also given to the patient to review.    The patient will follow up in  1 years for surveillance.  The patient was told to call with any further questions, or with any concerns about this method of contraception.  Emphasized use of condoms 100% of the time for STI prevention.  Need for ECP was assessed.  ECP not offered due to continued use of birth control method.   1. Family planning counseling -31 year old female in clinic today for a physical and STD screening. -ROS reviewed.  Patient reports chest discomfort on and off since the beginning of the year, easily bruises, headache that has been present for 7 years, and nausea.  Advised patient to follow up with PCP regarding chest discomfort as well as her neurologist for headaches.  Patient advised to increase water intake, proper nutrition with protein, as well as rest.   -Patient desires to continue with Nexplanon as birth control method.   2. Screening examination for venereal disease -STD screening  today. -Patient accepted all screenings including vaginal CT/GC, wet prep and bloodwork for HIV/RPR.  Patient meets criteria for HepB screening? Yes. Ordered? Yes Patient meets criteria for HepC screening? Yes. Ordered? Yes  Treat wet prep per standing order Discussed time line for State Lab results and that patient will be called with positive results and encouraged patient to call if she had not heard in 2 weeks.  Counseled to return or seek care for continued or worsening symptoms Recommended condom use with all sex  Patient is currently using *Nexplanon to prevent pregnancy.   - Chlamydia/Gonorrhea [place] [facility]  - HIV/HCV [facility] - Syphilis Serology, [facility] - HBV Antigen/Antibody State Lab - WET PREP FOR TRICH, YEAST, CLUE  3. Well woman exam with routine gynecological exam -Normal well woman exam. -CBE today, next CBE 07/2025 -PAP today  - IGP, rfx Aptima HPV ASCU  4. Depression, unspecified depression type -History of depression. -PHQ-9= 10, patient agrees to counseling services.  Denies thoughts of self harm.   - Ambulatory referral to Behavioral Health  Total time spent: 30 minutes   Return in about 1 year (around 08/13/2023) for Annual well-woman exam.    Glenna Fellows, FNP

## 2022-08-17 LAB — IGP, RFX APTIMA HPV ASCU: PAP Smear Comment: 0

## 2022-08-18 ENCOUNTER — Ambulatory Visit: Payer: Medicaid Other | Admitting: Licensed Clinical Social Worker

## 2022-08-25 DIAGNOSIS — R590 Localized enlarged lymph nodes: Secondary | ICD-10-CM | POA: Diagnosis not present

## 2022-08-25 DIAGNOSIS — L739 Follicular disorder, unspecified: Secondary | ICD-10-CM | POA: Diagnosis not present

## 2022-09-08 ENCOUNTER — Ambulatory Visit: Payer: Medicaid Other | Admitting: Licensed Clinical Social Worker

## 2023-02-21 ENCOUNTER — Telehealth: Payer: Self-pay

## 2023-02-21 NOTE — Telephone Encounter (Signed)
[facility] [facility] - Client TELEPHONE ADVICE RECORD [facility] Patient Name: Robin [name] MEDF ORD Gender: Female DOB: January 27, 1991 Age: 32 Y 1 M 31 D Return Phone Number: [phone] (Primary), [phone] (Secondary) Address[place]  [address] Client [facility] - Client Client Site [place] - Day Provider Romilda Garret- NP Contact Type Call Who Is Calling Patient / Member / Family / Caregiver Call Type Triage / Clinical Relationship To Patient Self Return Phone Number [phone] (Primary) Chief Complaint CHEST PAIN - pain, pressure, heaviness or tightness Reason for Call Symptomatic / Request for [place] states she is having chest pain and pain in left arm. Translation No Nurse Assessment Nurse: Susy Manor, RN, Megan Date/Time (Eastern Time): 02/21/2023 1:48:17 PM Confirm and document reason for call. If symptomatic, describe symptoms. ---Caller states she is having chest pain and pain in left arm. Sx started about a week ago. Does the patient have any new or worsening symptoms? ---Yes Will a triage be completed? ---Yes Related visit to physician within the last 2 weeks? ---No Does the PT have any chronic conditions? (i.e. diabetes, asthma, this includes High risk factors for pregnancy, etc.) ---No Is the patient pregnant or possibly pregnant? (Ask all females between the ages of 36-55) ---No Is this a behavioral health or substance abuse call? ---No Guidelines Guideline Title Affirmed Question Affirmed Notes Nurse Date/Time Eilene Ghazi Time) Chest Pain SEVERE chest pain Susy Manor, RN, Megan 02/21/2023 1:49:30 PM Disp. Time Eilene Ghazi Time) Disposition Final User 02/21/2023 1:46:58 PM Send to Urgent Reinaldo Berber 02/21/2023 1:52:55 PM Go to ED Now Yes Susy Manor, RN, Megan Final Disposition 02/21/2023 1:52:55 PM Go to ED Now Yes Susy Manor, RN,  Megan PLEASE NOTE: All timestamps contained within this report are represented as [place] Standard Time. CONFIDENTIALTY NOTICE: This fax transmission is intended only for the addressee. It contains information that is legally privileged, confidential or otherwise protected from use or disclosure. If you are not the intended recipient, you are strictly prohibited from reviewing, disclosing, copying using or disseminating any of this information or taking any action in reliance on or regarding this information. If you have received this fax in error, please notify us immediately by telephone so that we can arrange for its return to [place]. Phone: [phone], Toll-Free: [phone], Fax: [phone] Page: 2 of 2 Call Id: [id number] [place] Disagree/Comply Comply Caller Understands Yes PreDisposition Call Doctor Care Advice Given Per Guideline GO TO ED NOW: * You need to be seen in the Emergency Department. NOTE TO TRIAGER - DRIVING: * Another adult should drive. NOTHING BY MOUTH: * Do not eat or drink anything for now. CALL EMS IF: * Severe difficulty breathing occurs * Passes out or becomes too weak to stand * You become worse CARE ADVICE given per Chest Pain (Adult) guideline. Referrals [place]

## 2023-02-21 NOTE — Telephone Encounter (Signed)
I spoke with pt; she is waiting for someone to come and watch her children while pt is at [facility] ED. Pt hopes to be leaving soon for ED.UC & ED precautions reviewed with pt and pt voiced understanding. Pt does not want to cancel appt she already has scheduled with Matt on 02/25/23 at 2:40. Sending note to Romilda Garret NP and cable pool.

## 2023-02-21 NOTE — Telephone Encounter (Signed)
Noted. Agree that she needs to be seen

## 2023-02-25 ENCOUNTER — Ambulatory Visit: Payer: Medicaid Other | Admitting: Nurse Practitioner

## 2023-03-04 ENCOUNTER — Ambulatory Visit: Payer: Medicaid Other | Admitting: Nurse Practitioner

## 2023-03-28 ENCOUNTER — Other Ambulatory Visit: Payer: Self-pay

## 2023-03-28 ENCOUNTER — Emergency Department
Admission: EM | Admit: 2023-03-28 | Discharge: 2023-03-28 | Disposition: A | Discharge status: Home / Self Care | Payer: Medicaid Other | Attending: Emergency Medicine | Admitting: Emergency Medicine

## 2023-03-28 DIAGNOSIS — R590 Localized enlarged lymph nodes: Secondary | ICD-10-CM | POA: Insufficient documentation

## 2023-03-28 DIAGNOSIS — L03112 Cellulitis of left axilla: Secondary | ICD-10-CM | POA: Diagnosis not present

## 2023-03-28 DIAGNOSIS — L0390 Cellulitis, unspecified: Secondary | ICD-10-CM

## 2023-03-28 MED ORDER — SULFAMETHOXAZOLE-TRIMETHOPRIM 800-160 MG PO TABS
1.0000 | ORAL_TABLET | Freq: Once | ORAL | Status: AC
  Administered 2023-03-28: 1 via ORAL
  Filled 2023-03-28: qty 1

## 2023-03-28 MED ORDER — SULFAMETHOXAZOLE-TRIMETHOPRIM 800-160 MG PO TABS: 1.0000 | ORAL_TABLET | Freq: Two times a day (BID) | ORAL | 0 refills | Status: DC

## 2023-03-28 NOTE — ED Provider Notes (Signed)
   [facility] Provider Note    Event Date/Time   First MD Initiated Contact with Patient 03/28/23 1657     (approximate)  History   Chief Complaint: Abscess  HPI  Robin [name] is a 32 y.o. female with a past medical history of anxiety, depression, presents emergency department for swollen red area to left armpit.  According to the patient for the past several months she has had a mild swelling and redness to the left axilla.  Patient states she is taken antibiotics for this in the past many months ago and it went away but has since come back but has been present for several months per patient.  Patient denies any fever.  Denies any lumps bumps or masses in the breast.    Physical Exam   Triage Vital Signs: ED Triage Vitals [03/28/23 1526]  Enc Vitals Group     BP 128/82     Pulse Rate 77     Resp 18     Temp 98.6 F (37 C)     Temp Source Oral     SpO2 98 %     Weight      Height      Head Circumference      Peak Flow      Pain Score 7     Pain Loc      Pain Edu?      Excl. in [place]?     Most recent vital signs: Vitals:   03/28/23 1526  BP: 128/82  Pulse: 77  Resp: 18  Temp: 98.6 F (37 C)  SpO2: 98%    General: Awake, no distress.  CV:  Good peripheral perfusion.  Regular rate and rhythm  Resp:  Normal effort.  Equal breath sounds bilaterally.  Abd:  No distention.  Other:  Mild erythema mild tenderness overlying left armpit over an area most consistent with a lymph node.  No pointing no fluctuance to suggest abscess.   ED Results / Procedures / Treatments    MEDICATIONS ORDERED IN ED: Medications  sulfamethoxazole-trimethoprim (BACTRIM DS) 800-160 MG per tablet 1 tablet (has no administration in time range)     IMPRESSION / MDM / ASSESSMENT AND PLAN / ED COURSE  I reviewed the triage vital signs and the nursing notes.  Patient's presentation is most consistent with acute illness / injury with system  symptoms.  Patient presents emergency department for what appears to be a possible infected lymph node at left armpit.  Patient denies any masses bumps or lumps to the breast.  States she has resolved with antibiotics in the past but it keeps coming back.  Will refer to general surgery we will cover with Bactrim double strength twice daily for the next 10 days.  Discussed with the patient to follow-up with her PCP as well.  Patient is agreeable to plan of care.  FINAL CLINICAL IMPRESSION(S) / ED DIAGNOSES   Infected lymph node  Rx / DC Orders   Bactrim DS  Note:  This document was prepared using Dragon voice recognition software and may include unintentional dictation errors.   Harvest Dark, MD 03/28/23 1729

## 2023-03-28 NOTE — Discharge Instructions (Signed)
As we discussed please take your antibiotics as prescribed for the next 10 days.  Please follow-up with general surgery by calling the number provided to arrange a follow-up appointment.  Please also follow-up with your doctor.  Return to the emergency department for any increase in redness swelling any fever or any other symptom personally concerning to yourself.

## 2023-03-28 NOTE — ED Triage Notes (Signed)
Pt to ED via POV from home. Pt reports abscess under left axilla that has been intermittent. Pt reports never having to have it drained but has been placed on PO antibiotic.  before. Pt reports abscess is back and pain is worse and radiates down left arm.

## 2023-04-23 ENCOUNTER — Emergency Department
Admission: EM | Admit: 2023-04-23 | Discharge: 2023-04-23 | Disposition: A | Discharge status: Home / Self Care | Payer: Medicaid Other | Attending: Emergency Medicine | Admitting: Emergency Medicine

## 2023-04-23 ENCOUNTER — Other Ambulatory Visit: Payer: Self-pay

## 2023-04-23 DIAGNOSIS — L089 Local infection of the skin and subcutaneous tissue, unspecified: Secondary | ICD-10-CM | POA: Insufficient documentation

## 2023-04-23 DIAGNOSIS — N644 Mastodynia: Secondary | ICD-10-CM | POA: Insufficient documentation

## 2023-04-23 DIAGNOSIS — N611 Abscess of the breast and nipple: Secondary | ICD-10-CM | POA: Diagnosis not present

## 2023-04-23 MED ORDER — DOXYCYCLINE HYCLATE 100 MG PO CAPS
100.0000 mg | ORAL_CAPSULE | Freq: Two times a day (BID) | ORAL | 0 refills | Status: AC
Start: 2023-04-23 — End: ?

## 2023-04-23 NOTE — Discharge Instructions (Signed)
Begin taking the doxycycline tomorrow.  Stop the Bactrim.  Use a warm compress over the tender area off-and-on throughout the day.  Keep your scheduled follow-up appointment with primary care.  Return to the emergency department if your symptoms change or worsen between now and your appointment.

## 2023-04-23 NOTE — ED Provider Notes (Signed)
[facility] Provider Note    Event Date/Time   First MD Initiated Contact with Patient 04/23/23 1824     (approximate)   History   Breast Pain   HPI  Robin [name] is a 32 y.o. female with history of tension headaches, anxiety, and as listed in EMR presents to the emergency department for treatment and evaluation of tender lesion on the left nipple.  She is unsure if she was bitten by something.  Area now has a yellow spot in the center.  No fever.  No discharge from the nipple.Marland Kitchen      Physical Exam   Triage Vital Signs: ED Triage Vitals  Enc Vitals Group     BP 04/23/23 1802 106/67     Pulse Rate 04/23/23 1802 74     Resp 04/23/23 1802 15     Temp 04/23/23 1802 98.9 F (37.2 C)     Temp Source 04/23/23 1802 Oral     SpO2 04/23/23 1802 98 %     Weight 04/23/23 1800 142 lb (64.4 kg)     Height 04/23/23 1800 5\' 4"  (1.626 m)     Head Circumference --      Peak Flow --      Pain Score 04/23/23 1800 8     Pain Loc --      Pain Edu? --      Excl. in [place]? --     Most recent vital signs: Vitals:   04/23/23 1802  BP: 106/67  Pulse: 74  Resp: 15  Temp: 98.9 F (37.2 C)  SpO2: 98%    General: Awake, no distress.  CV:  Good peripheral perfusion.  Resp:  Normal effort.  Abd:  No distention.  Other:  Superficial, less than 1 cm pustule on left nipple without surrounding erythema or induration.   ED Results / Procedures / Treatments   Labs (all labs ordered are listed, but only abnormal results are displayed) Labs Reviewed - No data to display   EKG    RADIOLOGY  Image and radiology report reviewed and interpreted by me. Radiology report consistent with the same.  Not indicated  PROCEDURES:  Critical Care performed: No  Procedures   MEDICATIONS ORDERED IN ED:  Medications - No data to display   IMPRESSION / MDM / ASSESSMENT AND PLAN / ED COURSE   I have reviewed the triage note.  Differential diagnosis  includes, but is not limited to, breast abscess, cellulitis  Patient's presentation is most consistent with acute illness / injury with system symptoms.  32 year old female presenting to the emergency department for treatment and evaluation of breast pain.  See HPI for further details.  On exam, she has a focal, superficial raised pustule on the left side of the nipple.  Her nipple is erythematous.  Erythema does not extend to the areola or the breast.  Breast is soft and without induration.  Plan will be to treat her with antibiotic and have her keep her scheduled follow-up appointment with her primary care provider in 3 days.  ER return precautions were discussed as well.      FINAL CLINICAL IMPRESSION(S) / ED DIAGNOSES   Final diagnoses:  Skin pustule  Acute breast pain     Rx / DC Orders   ED Discharge Orders          Ordered    doxycycline (VIBRAMYCIN) 100 MG capsule  2 times daily        04/23/23  1610             Note:  This document was prepared using Dragon voice recognition software and may include unintentional dictation errors.   Chinita Pester, FNP 04/23/23 1845    Minna Antis, MD 04/23/23 1925

## 2023-04-23 NOTE — ED Notes (Signed)
Deferred physical assessment to NP.

## 2023-04-23 NOTE — ED Triage Notes (Signed)
Pt to ED for L nipple and breast pain since 2 days ago. Pt has small reddened area with small area that appears lijke a scab. Pt denies biting or breast trauma.

## 2023-04-26 ENCOUNTER — Ambulatory Visit: Payer: Medicaid Other | Admitting: Nurse Practitioner

## 2023-04-26 VITALS — BP 110/64 | HR 90 | Temp 98.3°F | Resp 16 | Ht 64.0 in | Wt 149.4 lb

## 2023-04-26 DIAGNOSIS — R1013 Epigastric pain: Secondary | ICD-10-CM

## 2023-04-26 DIAGNOSIS — Z09 Encounter for follow-up examination after completed treatment for conditions other than malignant neoplasm: Secondary | ICD-10-CM | POA: Insufficient documentation

## 2023-04-26 DIAGNOSIS — R0789 Other chest pain: Secondary | ICD-10-CM | POA: Diagnosis not present

## 2023-04-26 LAB — COMPREHENSIVE METABOLIC PANEL
ALT: 14 U/L (ref 0–35)
AST: 16 U/L (ref 0–37)
Albumin: 4.2 g/dL (ref 3.5–5.2)
Alkaline Phosphatase: 48 U/L (ref 39–117)
BUN: 12 mg/dL (ref 6–23)
CO2: 24 mEq/L (ref 19–32)
Calcium: 8.9 mg/dL (ref 8.4–10.5)
Chloride: 108 mEq/L (ref 96–112)
Creatinine, Ser: 0.72 mg/dL (ref 0.40–1.20)
GFR: 113.05 mL/min (ref >60.00)
Glucose, Bld: 86 mg/dL (ref 70–99)
Potassium: 4 mEq/L (ref 3.5–5.1)
Sodium: 138 mEq/L (ref 135–145)
Total Bilirubin: 0.3 mg/dL (ref 0.2–1.2)
Total Protein: 6.7 g/dL (ref 6.0–8.3)

## 2023-04-26 LAB — CBC
HCT: 36.7 % (ref 36.0–46.0)
Hemoglobin: 12.2 g/dL (ref 12.0–15.0)
MCHC: 33.2 g/dL (ref 30.0–36.0)
MCV: 91 fl (ref 78.0–100.0)
Platelets: 280 10*3/uL (ref 150.0–400.0)
RBC: 4.03 Mil/uL (ref 3.87–5.11)
RDW: 14.4 % (ref 11.5–15.5)
WBC: 6.3 10*3/uL (ref 4.0–10.5)

## 2023-04-26 LAB — LIPASE: Lipase: 23 U/L (ref 11.0–59.0)

## 2023-04-26 MED ORDER — SUCRALFATE 1 G PO TABS: 1.0000 g | ORAL_TABLET | Freq: Three times a day (TID) | ORAL | 0 refills | Status: DC

## 2023-04-26 NOTE — Assessment & Plan Note (Signed)
Reproducible with certain range of motion.  Did inform patient to avoid NSAIDs she use Tylenol as needed.

## 2023-04-26 NOTE — Assessment & Plan Note (Signed)
Select patient to have a gastritis or ulcer.  Will check basic basic blood work inclusive of CMP CBC and lipase.  Start patient on Carafate 1 g 4 times daily for 10 days.  Will also send off H. pylori stool culture.  She will let me know if it improves or not if no improvement would consider GI for endoscopy

## 2023-04-26 NOTE — Progress Notes (Signed)
Acute Office Visit  Subjective:     Patient ID: Robin [name], female    DOB: [birth year], 32 y.o.   MRN: [id number]  Chief Complaint  Patient presents with   Chest Pain    On the left side X 3-4 weeks. Comes and goes.   Abdominal Pain    Hurts to eat feel sick all the time. Hurts in the middle of the stomach feels like someone is squeezing her stomach. Been going on for a long time but is getting worse. Comes and goes.     Patient is in today for multiple complaints with a history of Migraines, anxiety, depression.  Of note patient was seen in the ed on 04/23/2023 and dx with a skin pustle and placed on doxycycline 100mg  BID has been on the medication approx days . States that she has used warm compresses and it ruptured. States that it is improving   Abdominal pain: states that it has  been going on for some times. States that it has been years. States that it has gotten worse over time. States it feels like a squeeing pain in the epigastric pain. States that it hurts to eat and then intermittent nausea. States that she will vomit sometimes. States that it will stay sometimes. Last Bm was this am that is normal. States that she has not tried anything otc. Has tried prolisec in the past that did not help. States that she does not use NSAID. Alcohol use is none  States that her mother had a hital hernia   Chest pain: left side that comes and goes. States that it is around the breast.  Described as sharp. Does not happen with stomach. Sometimes with movement. States that she will get short of breath when it happens. Does not have a history of blood clots. No recent travel or surgeries   States that she does exercise sometimes. States walking around a lot and taking care of her 3 kids   Review of Systems  Constitutional:  Negative for chills and fever.  Cardiovascular:  Positive for chest pain.  Gastrointestinal:  Positive for abdominal pain and nausea. Negative for blood in  stool, constipation, diarrhea and vomiting.  Neurological:  Positive for headaches.        Objective:    BP 110/64   Pulse 90   Temp 98.3 F (36.8 C)   Resp 16   Ht 5\' 4"  (1.626 m)   Wt 149 lb 6 oz (67.8 kg)   LMP 03/28/2023   SpO2 99%   BMI 25.64 kg/m  BP Readings from Last 3 Encounters:  04/26/23 110/64  04/23/23 106/67  03/28/23 124/85   Wt Readings from Last 3 Encounters:  04/26/23 149 lb 6 oz (67.8 kg)  04/23/23 142 lb (64.4 kg)  08/12/22 141 lb (64 kg)      Physical Exam Vitals and nursing note reviewed.  Constitutional:      Appearance: Normal appearance.  Cardiovascular:     Rate and Rhythm: Normal rate and regular rhythm.     Heart sounds: Normal heart sounds.     Comments: Able to elicit with back extension and torso oblique turns  Pulmonary:     Effort: Pulmonary effort is normal.     Breath sounds: Normal breath sounds.  Chest:     Chest wall: No tenderness.  Abdominal:     General: Bowel sounds are normal. There is no distension.     Palpations: There is no mass.  Tenderness: There is abdominal tenderness.     Hernia: No hernia is present.    Neurological:     Mental Status: She is alert.     No results found for any visits on 04/26/23.      Assessment & Plan:   Problem List Items Addressed This Visit       Other   Epigastric pain    Select patient to have a gastritis or ulcer.  Will check basic basic blood work inclusive of CMP CBC and lipase.  Start patient on Carafate 1 g 4 times daily for 10 days.  Will also send off H. pylori stool culture.  She will let me know if it improves or not if no improvement would consider GI for endoscopy      Relevant Medications   sucralfate (CARAFATE) 1 g tablet   Other Relevant Orders   CBC   Comprehensive metabolic panel   Lipase   Helicobacter Pylori Special Antigen, Stool   Chest wall pain - Primary    Reproducible with certain range of motion.  Did inform patient to avoid NSAIDs she  use Tylenol as needed.      Hospital discharge follow-up    Did review the hospital ED note no imaging or blood work was drawn.       Meds ordered this encounter  Medications   sucralfate (CARAFATE) 1 g tablet    Sig: Take 1 tablet (1 g total) by mouth 4 (four) times daily -  with meals and at bedtime for 10 days.    Dispense:  40 tablet    Refill:  0    Order Specific Question:   Supervising Provider    Answer:   Roxy Manns A [[id number]]    Return in about 6 months (around 10/26/2023) for CPE and Labs.  Audria Nine, NP

## 2023-04-26 NOTE — Patient Instructions (Signed)
Nice to see you today The chest pain I feel is chest wall. You can use tylenol if you need it  I have sent in some medication to help with your stomach I will be in touch with the labs once I have them

## 2023-04-26 NOTE — Assessment & Plan Note (Signed)
Did review the hospital ED note no imaging or blood work was drawn.

## 2023-05-03 ENCOUNTER — Other Ambulatory Visit: Payer: Self-pay | Admitting: Radiology

## 2023-05-03 DIAGNOSIS — R1013 Epigastric pain: Secondary | ICD-10-CM | POA: Diagnosis not present

## 2023-05-04 LAB — HELICOBACTER PYLORI  SPECIAL ANTIGEN
MICRO NUMBER:: 14923993
SPECIMEN QUALITY: ADEQUATE

## 2023-05-27 ENCOUNTER — Emergency Department
Admission: EM | Admit: 2023-05-27 | Discharge: 2023-05-27 | Discharge status: Against Medical Advice | Payer: Medicaid Other | Attending: Emergency Medicine | Admitting: Emergency Medicine

## 2023-05-27 ENCOUNTER — Other Ambulatory Visit: Payer: Self-pay

## 2023-05-27 DIAGNOSIS — Z5321 Procedure and treatment not carried out due to patient leaving prior to being seen by health care provider: Secondary | ICD-10-CM | POA: Insufficient documentation

## 2023-05-27 DIAGNOSIS — R109 Unspecified abdominal pain: Secondary | ICD-10-CM | POA: Diagnosis not present

## 2023-05-27 LAB — COMPREHENSIVE METABOLIC PANEL
ALT: 14 U/L (ref 0–44)
AST: 23 U/L (ref 15–41)
Albumin: 4.4 g/dL (ref 3.5–5.0)
Alkaline Phosphatase: 54 U/L (ref 38–126)
Anion gap: 9 (ref 5–15)
BUN: 9 mg/dL (ref 6–20)
CO2: 23 mmol/L (ref 22–32)
Calcium: 8.9 mg/dL (ref 8.9–10.3)
Chloride: 105 mmol/L (ref 98–111)
Creatinine, Ser: 0.69 mg/dL (ref 0.44–1.00)
GFR, Estimated: >60 mL/min (ref >60)
Glucose, Bld: 113 mg/dL — ABNORMAL HIGH (ref 70–99)
Potassium: 3.3 mmol/L — ABNORMAL LOW (ref 3.5–5.1)
Sodium: 137 mmol/L (ref 135–145)
Total Bilirubin: 0.4 mg/dL (ref 0.3–1.2)
Total Protein: 7.2 g/dL (ref 6.5–8.1)

## 2023-05-27 LAB — URINALYSIS, ROUTINE W REFLEX MICROSCOPIC
Bilirubin Urine: NEGATIVE
Glucose, UA: NEGATIVE mg/dL
Hgb urine dipstick: NEGATIVE
Ketones, ur: NEGATIVE mg/dL
Leukocytes,Ua: NEGATIVE
Nitrite: NEGATIVE
Protein, ur: NEGATIVE mg/dL
Specific Gravity, Urine: 1.003 — ABNORMAL LOW (ref 1.005–1.030)
pH: 6 (ref 5.0–8.0)

## 2023-05-27 LAB — CBC
HCT: 37.2 % (ref 36.0–46.0)
Hemoglobin: 12.7 g/dL (ref 12.0–15.0)
MCH: 30.6 pg (ref 26.0–34.0)
MCHC: 34.1 g/dL (ref 30.0–36.0)
MCV: 89.6 fL (ref 80.0–100.0)
Platelets: 271 10*3/uL (ref 150–400)
RBC: 4.15 MIL/uL (ref 3.87–5.11)
RDW: 12.6 % (ref 11.5–15.5)
WBC: 7.9 10*3/uL (ref 4.0–10.5)
nRBC: 0 % (ref 0.0–0.2)

## 2023-05-27 LAB — LIPASE, BLOOD: Lipase: 30 U/L (ref 11–51)

## 2023-05-27 LAB — POC URINE PREG, ED: Preg Test, Ur: NEGATIVE

## 2023-05-27 NOTE — ED Notes (Addendum)
Pt reports she has to leave. Been waiting on appt from GI from endoscopy.

## 2023-05-27 NOTE — ED Triage Notes (Signed)
Pt to ed from home for abdominal pain x 2 years. She has been to pcp and was placed on sucralfate. Pt is caox4, in no acute distress and ambulatory in triage.

## 2023-06-09 ENCOUNTER — Ambulatory Visit: Payer: Medicaid Other | Admitting: Nurse Practitioner

## 2023-06-10 ENCOUNTER — Encounter: Payer: Self-pay | Admitting: Nurse Practitioner

## 2023-07-11 DIAGNOSIS — M545 Low back pain, unspecified: Secondary | ICD-10-CM | POA: Diagnosis not present

## 2023-07-11 DIAGNOSIS — N200 Calculus of kidney: Secondary | ICD-10-CM | POA: Diagnosis not present

## 2023-07-11 DIAGNOSIS — Z3202 Encounter for pregnancy test, result negative: Secondary | ICD-10-CM | POA: Diagnosis not present

## 2023-07-26 DIAGNOSIS — M79671 Pain in right foot: Secondary | ICD-10-CM | POA: Diagnosis not present

## 2023-07-26 DIAGNOSIS — M722 Plantar fascial fibromatosis: Secondary | ICD-10-CM | POA: Diagnosis not present

## 2023-07-26 DIAGNOSIS — M79672 Pain in left foot: Secondary | ICD-10-CM | POA: Diagnosis not present

## 2023-07-27 ENCOUNTER — Ambulatory Visit: Payer: Medicaid Other | Admitting: Nurse Practitioner

## 2023-07-29 ENCOUNTER — Ambulatory Visit: Payer: Medicaid Other | Admitting: Nurse Practitioner

## 2023-09-16 ENCOUNTER — Ambulatory Visit: Payer: Medicaid Other

## 2023-10-13 ENCOUNTER — Ambulatory Visit: Payer: Medicaid Other

## 2023-10-13 ENCOUNTER — Ambulatory Visit: Payer: Medicaid Other | Admitting: Family Medicine

## 2023-10-13 VITALS — BP 121/75 | HR 79 | Ht 63.5 in | Wt 142.0 lb

## 2023-10-13 DIAGNOSIS — Z3046 Encounter for surveillance of implantable subdermal contraceptive: Secondary | ICD-10-CM

## 2023-10-13 DIAGNOSIS — Z0000 Encounter for general adult medical examination without abnormal findings: Secondary | ICD-10-CM | POA: Diagnosis not present

## 2023-10-13 DIAGNOSIS — Z30017 Encounter for initial prescription of implantable subdermal contraceptive: Secondary | ICD-10-CM | POA: Diagnosis not present

## 2023-10-13 DIAGNOSIS — Z309 Encounter for contraceptive management, unspecified: Secondary | ICD-10-CM | POA: Diagnosis not present

## 2023-10-13 DIAGNOSIS — Z3009 Encounter for other general counseling and advice on contraception: Secondary | ICD-10-CM

## 2023-10-13 DIAGNOSIS — Z113 Encounter for screening for infections with a predominantly sexual mode of transmission: Secondary | ICD-10-CM

## 2023-10-13 LAB — WET PREP FOR TRICH, YEAST, CLUE
Trichomonas Exam: NEGATIVE
Yeast Exam: NEGATIVE

## 2023-10-13 LAB — HM HIV SCREENING LAB: HM HIV Screening: NEGATIVE

## 2023-10-13 MED ORDER — ETONOGESTREL 68 MG ~~LOC~~ IMPL
68.0000 mg | DRUG_IMPLANT | Freq: Once | SUBCUTANEOUS | Status: AC
Start: 2023-10-13 — End: 2023-10-13
  Administered 2023-10-13: 68 mg via SUBCUTANEOUS

## 2023-10-13 NOTE — Progress Notes (Signed)
[facility] DEPARTMENT [facility] [address] Main Number: [phone]  Family Planning Visit- Repeat Yearly Visit  Subjective:  Robin [name] is a 32 y.o. [phone]  being seen today for an annual wellness visit and to discuss contraception options.   The patient is currently using Hormonal Implant for pregnancy prevention. Patient does not want a pregnancy in the next year.   she/her/hers report they are looking for a method that provides High efficacy at preventing pregnancy   Patient has the following medical problems: has Migraine headache; Memory loss; Left hip pain; Chronic tension-type headache, intractable; Other fatigue; Anxiety and depression; Epigastric pain; and Chest wall pain on their problem list.  Chief Complaint  Patient presents with   Annual Exam    Nex removal/reinsert    Patient reports to clinic for nexplanon removal and reinsertion.   Patient denies concerns about self. Reports "swollen neck glands" on intake form but states that was months ago and it has since gone away. Reports headaches that she has had her whole life.    See flowsheet for other program required questions.   Body mass index is 24.76 kg/m. - Patient is eligible for diabetes screening based on BMI> 25 and age >35?  no HA1C ordered? not applicable  Patient reports 1 of partners in last year. Desires STI screening?  Yes   Has patient been screened once for HCV in the past?  No  No results found for: "HCVAB"  Does the patient have current of drug use, have a partner with drug use, and/or has been incarcerated since last result? No  If yes-- Screen for HCV through [facility]   Does the patient meet criteria for HBV testing? No  Criteria:  -Household, sexual or needle sharing contact with HBV -History of drug use -HIV positive -Those with known Hep C   Health Maintenance Due  Topic Date Due   HPV VACCINES (2 - 3-dose series)  04/09/2009   INFLUENZA VACCINE  07/28/2023   COVID-19 Vaccine (3 - 2023-24 season) 08/28/2023    Review of Systems  Constitutional:  Negative for weight loss.  Eyes:  Negative for blurred vision.  Respiratory:  Negative for cough and shortness of breath.   Cardiovascular:  Negative for claudication.  Gastrointestinal:  Negative for nausea.  Genitourinary:  Negative for dysuria and frequency.  Skin:  Negative for rash.  Neurological:  Positive for headaches.  Endo/Heme/Allergies:  Does not bruise/bleed easily.    The following portions of the patient's history were reviewed and updated as appropriate: allergies, current medications, past family history, past medical history, past social history, past surgical history and problem list. Problem list updated.  Objective:   Vitals:   10/13/23 1016  BP: 121/75  Pulse: 79  Weight: 142 lb (64.4 kg)  Height: 5' 3.5" (1.613 m)    Physical Exam Constitutional:      Appearance: Normal appearance.  HENT:     Head: Normocephalic and atraumatic.  Pulmonary:     Effort: Pulmonary effort is normal.  Abdominal:     Palpations: Abdomen is soft.  Musculoskeletal:        General: Normal range of motion.  Skin:    General: Skin is warm and dry.  Neurological:     General: No focal deficit present.     Mental Status: She is alert.  Psychiatric:        Mood and Affect: Mood normal.  Behavior: Behavior normal.       Assessment and Plan:  Robin [name] is a 32 y.o. female G3P3003 presenting to the [facility] Department for an yearly wellness and contraception visit  1. Family planning Contraception counseling: Reviewed options based on patient desire and reproductive life plan. Patient is interested in Hormonal Implant. This was provided to the patient today.   Risks, benefits, and typical effectiveness rates were reviewed.  Questions were answered.  Written information was also given to the patient to  review.    The patient will follow up in  1 years for surveillance.  The patient was told to call with any further questions, or with any concerns about this method of contraception.  Emphasized use of condoms 100% of the time for STI prevention.  Educated on ECP and assessed need for ECP. Not indicated-has nexplanon   2. Encounter for removal and reinsertion of Nexplanon Nexplanon Removal and Insertion  Patient identified, informed consent performed, consent signed.   Patient does understand that irregular bleeding is a very common side effect of this medication. She was advised to have backup contraception for one week after replacement of the implant. Patient deemed to meet WHO criteria for being reasonably certain she is not pregnant.  Appropriate time out taken. Nexplanon site identified. Area prepped in usual sterile fashon. 3 ml of 1% lidocaine with epinephrine was used to anesthetize the area at the distal end of the implant. A small stab incision was made right beside the implant on the distal portion. The Nexplanon rod was grasped using hemostats and removed without difficulty. There was minimal blood loss. There were no complications.   Confirmed correct location of insertion site. The insertion site was identified 8-10 cm (3-4 inches) from the medial epicondyle of the humerus and 3-5 cm (1.25-2 inches) posterior to (below) the sulcus (groove) between the biceps and triceps muscles of the patient's left arm. New Nexplanon removed from packaging, Device confirmed in needle, then inserted full length of needle and withdrawn per handbook instructions. Nexplanon was able to palpated in the patient's left arm; patient palpated the insert herself.  There was minimal blood loss. Patient insertion site covered with guaze and a pressure bandage to reduce any bruising. The patient tolerated the procedure well and was given post procedure instructions.     Nexplanon:   Counseled patient to take OTC  analgesic starting as soon as lidocaine starts to wear off and take regularly for at least 48 hr to decrease discomfort.  Specifically to take with food or milk to decrease stomach upset and for IB 600 mg (3 tablets) every 6 hrs; IB 800 mg (4 tablets) every 8 hrs; or Aleve 2 tablets every 12 hrs.   - etonogestrel (NEXPLANON) implant 68 mg  3. Screening for venereal disease  - Chlamydia/Gonorrhea [place] [facility] - HIV [place] [facility] - Syphilis Serology, [place] [facility] - WET PREP FOR TRICH, YEAST, CLUE  4. Well woman exam (no gynecological exam) -last CBE done in 2023, next due in 2026 -pap smear up to date      Return in about 1 year (around 10/12/2024) for annual well-woman exam.  No future appointments.  Lenice Llamas, [place]

## 2023-10-13 NOTE — Progress Notes (Signed)
Pt is here for family planning visit.  Family planning packet reviewed and given to pt.  Wet prep results reviewed, no treatment required per standing orders. Condoms and how to find a therapist handout given. Gaspar Garbe, RN

## 2024-06-01 ENCOUNTER — Emergency Department
Admission: EM | Admit: 2024-06-01 | Discharge: 2024-06-01 | Disposition: A | Discharge status: Home / Self Care | Attending: Emergency Medicine | Admitting: Emergency Medicine

## 2024-06-01 ENCOUNTER — Other Ambulatory Visit: Payer: Self-pay

## 2024-06-01 DIAGNOSIS — L732 Hidradenitis suppurativa: Secondary | ICD-10-CM | POA: Diagnosis not present

## 2024-06-01 MED ORDER — CLINDAMYCIN HCL 150 MG PO CAPS
300.0000 mg | ORAL_CAPSULE | Freq: Once | ORAL | Status: AC
  Administered 2024-06-01: 300 mg via ORAL
  Filled 2024-06-01: qty 2

## 2024-06-01 MED ORDER — CLINDAMYCIN HCL 150 MG PO CAPS
300.0000 mg | ORAL_CAPSULE | Freq: Three times a day (TID) | ORAL | 0 refills | Status: AC
Start: 2024-06-01 — End: ?

## 2024-06-01 NOTE — ED Provider Notes (Signed)
 [facility] Provider Note    Event Date/Time   First MD Initiated Contact with Patient 06/01/24 1822     (approximate)   History   Abscess (armpit)   HPI  Robin [name] is a 33 y.o. female with no significant past medical history presents emergency department with concerns of a recurring abscess in the left axilla.  Symptoms have been ongoing for 2 days.  States most times it will go away on its own and sometimes she will have to get antibiotics.  Is not seeing dermatology or hidradenitis specialist.  States never been told that it was hidradenitis.      Physical Exam   Triage Vital Signs: ED Triage Vitals  Encounter Vitals Group     BP 06/01/24 1803 125/76     Systolic BP Percentile --      Diastolic BP Percentile --      Pulse Rate 06/01/24 1803 100     Resp 06/01/24 1803 16     Temp 06/01/24 1803 98.7 F (37.1 C)     Temp Source 06/01/24 1803 Oral     SpO2 06/01/24 1803 100 %     Weight --      Height 06/01/24 1804 5\' 3"  (1.6 m)     Head Circumference --      Peak Flow --      Pain Score 06/01/24 1804 8     Pain Loc --      Pain Education --      Exclude from Growth Chart --     Most recent vital signs: Vitals:   06/01/24 1803  BP: 125/76  Pulse: 100  Resp: 16  Temp: 98.7 F (37.1 C)  SpO2: 100%     General: Awake, no distress.   CV:  Good peripheral perfusion. Resp:  Normal effort.  Abd:  No distention.   Other:  Left axilla with a hard reddened raised area with some redness surrounding it, no fluctuance noted   ED Results / Procedures / Treatments   Labs (all labs ordered are listed, but only abnormal results are displayed) Labs Reviewed - No data to display   EKG     RADIOLOGY     PROCEDURES:   Procedures  Critical Care: No Chief Complaint  Patient presents with   Abscess    armpit      MEDICATIONS ORDERED IN ED: Medications  clindamycin  (CLEOCIN ) capsule 300 mg (300 mg Oral Given  06/01/24 1847)     IMPRESSION / MDM / ASSESSMENT AND PLAN / ED COURSE  I reviewed the triage vital signs and the nursing notes.                              Differential diagnosis includes, but is not limited to, hidradenitis, abscess, cellulitis  Patient's presentation is most consistent with acute, uncomplicated illness.    Medications given: Clindamycin  for infection  Did explain all findings to the patient.  Feel that she most likely has hidradenitis due to the recurring abscess in the same area frequently and that it does occasionally go away on its own.  She should follow-up with either a general surgeon or hidradenitis specialist.  Phone numbers were provided for both.  Take the clindamycin  as prescribed.  Return if worsening.  She is in agreement treatment plan.  Discharged stable condition.      FINAL CLINICAL IMPRESSION(S) / ED DIAGNOSES  Final diagnoses:  Hidradenitis     Rx / DC Orders   ED Discharge Orders          Ordered    clindamycin  (CLEOCIN ) 150 MG capsule  3 times daily        06/01/24 1838             Note:  This document was prepared using Dragon voice recognition software and may include unintentional dictation errors.    Delsie Figures, PA-C 06/01/24 1848    Bradler, Evan K, MD 06/01/24 1910

## 2024-06-01 NOTE — ED Triage Notes (Signed)
 Pt to ed from home via POV for an abscess under left armpit. Pt states "I get these all of the time". Pt denies any fever or chills. Abscess is red, not hot to the touch. Pt is caox4, in no acute distress and ambulatory in triage.

## 2024-09-17 DIAGNOSIS — B9689 Other specified bacterial agents as the cause of diseases classified elsewhere: Secondary | ICD-10-CM | POA: Diagnosis not present

## 2024-09-17 DIAGNOSIS — Z03818 Encounter for observation for suspected exposure to other biological agents ruled out: Secondary | ICD-10-CM | POA: Diagnosis not present

## 2024-09-17 DIAGNOSIS — R509 Fever, unspecified: Secondary | ICD-10-CM | POA: Diagnosis not present

## 2024-09-17 DIAGNOSIS — J019 Acute sinusitis, unspecified: Secondary | ICD-10-CM | POA: Diagnosis not present

## 2024-09-17 DIAGNOSIS — R0789 Other chest pain: Secondary | ICD-10-CM | POA: Diagnosis not present

## 2024-09-17 DIAGNOSIS — R051 Acute cough: Secondary | ICD-10-CM | POA: Diagnosis not present

## 2024-09-17 DIAGNOSIS — R112 Nausea with vomiting, unspecified: Secondary | ICD-10-CM | POA: Diagnosis not present

## 2024-09-17 DIAGNOSIS — J9801 Acute bronchospasm: Secondary | ICD-10-CM | POA: Diagnosis not present

## 2024-10-09 DIAGNOSIS — M25531 Pain in right wrist: Secondary | ICD-10-CM | POA: Diagnosis not present

## 2024-12-11 ENCOUNTER — Ambulatory Visit: Payer: Self-pay

## 2024-12-11 NOTE — Progress Notes (Unsigned)
° ° ° °  Robin Telford T. Carsen Machi, MD, CAQ Sports Medicine [facility] [address] [place], [address]  Phone: [phone]  FAX: [phone]  Robin [name] - 33 y.o. female  MRN [id number]  Date of Birth: 04/05/1991  Date: 12/12/2024  PCP: Wendee Lynwood HERO, NP  Referral: Wendee Lynwood HERO, NP  No chief complaint on file.  Subjective:   Robin [name] is a 33 y.o. very pleasant female patient with There is no height or weight on file to calculate BMI. who presents with the following:  Discussed the use of AI scribe software for clinical note transcription with the patient, who gave verbal consent to proceed.  Patient presents with flu-like symptoms. History of Present Illness     Review of Systems is noted in the HPI, as appropriate  Objective:   There were no vitals taken for this visit.  GEN: No acute distress; alert,appropriate. PULM: Breathing comfortably in no respiratory distress PSYCH: Normally interactive.   Laboratory and Imaging Data:  Assessment and Plan:   No diagnosis found. Assessment & Plan   Medication Management during today's office visit: No orders of the defined types were placed in this encounter.  There are no discontinued medications.  Orders placed today for conditions managed today: No orders of the defined types were placed in this encounter.   Disposition: No follow-ups on file.  [employer] One speech-to-text software was used for transcription in this dictation.  Possible transcriptional errors can occur using Animal nutritionist.   Signed,  Jacques DASEN. Meigan Pates, MD   Outpatient Encounter Medications as of 12/12/2024  Medication Sig   clindamycin  (CLEOCIN ) 150 MG capsule Take 2 capsules (300 mg total) by mouth 3 (three) times daily.   cyclobenzaprine  (FLEXERIL ) 5 MG tablet Take 1 tablet (5 mg total) by mouth 2 (two) times daily as needed for muscle spasms. (Patient not taking:  Reported on 10/13/2023)   doxycycline  (VIBRAMYCIN ) 100 MG capsule Take 1 capsule (100 mg total) by mouth 2 (two) times daily. (Patient not taking: Reported on 10/13/2023)   etonogestrel  (NEXPLANON ) 68 MG IMPL implant Nexplanon  68 mg subdermal implant  Inject 1 implant every day by subcutaneous route.   ibuprofen  (ADVIL ) 600 MG tablet Take 1 tablet (600 mg total) by mouth every 8 (eight) hours as needed for moderate pain. (Patient not taking: Reported on 10/13/2023)   sertraline  (ZOLOFT ) 25 MG tablet Take 1 tablet (25 mg total) by mouth daily. (Patient not taking: Reported on 10/13/2023)   sucralfate  (CARAFATE ) 1 g tablet Take 1 tablet (1 g total) by mouth 4 (four) times daily -  with meals and at bedtime for 10 days.   No facility-administered encounter medications on file as of 12/12/2024.

## 2024-12-11 NOTE — Telephone Encounter (Signed)
 Next Appt With Family Medicine Michail Schroeder, MD) 12/12/2024 at 4:00 PM

## 2024-12-11 NOTE — Telephone Encounter (Signed)
 FYI Only or Action Required?: Action required by provider: clinical question for provider. Would like medication called in for N/V, was scheduled for tomorrow afternoon.   Patient was last seen in primary care on 04/26/2023 by Wendee Lynwood HERO, NP.  Called Nurse Triage reporting Fever.  Symptoms began yesterday.  Interventions attempted: OTC medications: tylenol .  Symptoms are: unchanged.  Triage Disposition: Home Care  Patient/caregiver understands and will follow disposition?: Yes, will follow disposition  Copied from CRM #[id number]. Topic: Clinical - Red Word Triage >> Dec 11, 2024  2:47 PM Alfonso ORN wrote: Red Word that prompted transfer to Nurse Triage: body aches, rash on foot, temperature running high Reason for Disposition  [1] Fever AND [2] no signs of serious infection or localizing symptoms (all other triage questions negative)  Answer Assessment - Initial Assessment Questions 1. TEMPERATURE: What is the most recent temperature?  How was it measured?      Has not measured the temp at this time 2. ONSET: When did the fever start?      Chills and rash started last evening 3. CHILLS: Do you have chills? If yes: How bad are they?  (e.g., none, mild, moderate, severe)     moderate 4. OTHER SYMPTOMS: Do you have any other symptoms besides the fever?  (e.g., abdomen pain, cough, diarrhea, earache, headache, sore throat, urination pain)     Cough, headache, N/V, congestion 5. CAUSE: If there are no symptoms, ask: What do you think is causing the fever?      unsure 6. CONTACTS: Does anyone else in the family have an infection?     denies 7. TREATMENT: What have you done so far to treat this fever? (e.g., OTC fever medicines)     tylenol  8. IMMUNOCOMPROMISE: Do you have any of the following: diabetes, HIV positive, splenectomy, cancer chemotherapy, chronic steroid treatment, transplant patient, etc.?     denies 9. PREGNANCY: Is there any chance you are  pregnant? When was your last menstrual period?     denies 10. TRAVEL: Have you traveled out of the country in the last month? (e.g., travel history, exposures)       Denies  Mother would like something called in for N/V.  Protocols used: [facility]

## 2024-12-12 ENCOUNTER — Ambulatory Visit: Admitting: Family Medicine

## 2024-12-12 ENCOUNTER — Encounter: Payer: Self-pay | Admitting: Family Medicine

## 2024-12-12 VITALS — BP 100/72 | HR 99 | Temp 98.5°F | Ht 63.0 in | Wt 153.0 lb

## 2024-12-12 DIAGNOSIS — R051 Acute cough: Secondary | ICD-10-CM | POA: Diagnosis not present

## 2024-12-12 DIAGNOSIS — J101 Influenza due to other identified influenza virus with other respiratory manifestations: Secondary | ICD-10-CM

## 2024-12-12 LAB — POC COVID19 BINAXNOW: SARS Coronavirus 2 Ag: NEGATIVE

## 2024-12-12 LAB — POC INFLUENZA A&B (BINAX/QUICKVUE)
Influenza A, POC: POSITIVE — AB
Influenza B, POC: NEGATIVE

## 2024-12-12 MED ORDER — PROMETHAZINE HCL 25 MG RE SUPP: 25.0000 mg | Freq: Four times a day (QID) | RECTAL | 1 refills | Status: AC | PRN

## 2024-12-12 MED ORDER — ONDANSETRON 4 MG PO TBDP: 4.0000 mg | ORAL_TABLET | Freq: Three times a day (TID) | ORAL | 0 refills | Status: AC | PRN

## 2024-12-12 MED ORDER — OSELTAMIVIR PHOSPHATE 75 MG PO CAPS: 75.0000 mg | ORAL_CAPSULE | Freq: Two times a day (BID) | ORAL | 0 refills | Status: AC

## 2024-12-12 NOTE — Telephone Encounter (Signed)
 noted

## 2024-12-13 ENCOUNTER — Encounter: Payer: Self-pay | Admitting: Family Medicine
